# Patient Record
Sex: Female | Born: 1950 | Race: White | Hispanic: No | Smoking: Former smoker
Health system: Southern US, Community
[De-identification: ages and names within clinical notes are randomized; demographics above are authoritative.]

## PROBLEM LIST (undated history)

## (undated) DIAGNOSIS — I82409 Acute embolism and thrombosis of unspecified deep veins of unspecified lower extremity: Secondary | ICD-10-CM

## (undated) DIAGNOSIS — S83209A Unspecified tear of unspecified meniscus, current injury, unspecified knee, initial encounter: Secondary | ICD-10-CM

## (undated) DIAGNOSIS — F329 Major depressive disorder, single episode, unspecified: Secondary | ICD-10-CM

## (undated) DIAGNOSIS — F32A Depression, unspecified: Secondary | ICD-10-CM

## (undated) HISTORY — PX: TUBAL LIGATION: SHX77

## (undated) HISTORY — DX: Unspecified tear of unspecified meniscus, current injury, unspecified knee, initial encounter: S83.209A

## (undated) HISTORY — DX: Major depressive disorder, single episode, unspecified: F32.9

## (undated) HISTORY — DX: Depression, unspecified: F32.A

## (undated) HISTORY — PX: TONSILLECTOMY: SUR1361

---

## 2000-08-22 ENCOUNTER — Other Ambulatory Visit: Admission: RE | Admit: 2000-08-22 | Discharge: 2000-08-22 | Payer: Self-pay | Admitting: Gynecology

## 2001-04-26 ENCOUNTER — Encounter: Payer: Self-pay | Admitting: Internal Medicine

## 2001-04-26 ENCOUNTER — Encounter: Admission: RE | Admit: 2001-04-26 | Discharge: 2001-04-26 | Payer: Self-pay | Admitting: Internal Medicine

## 2002-05-07 ENCOUNTER — Ambulatory Visit (HOSPITAL_COMMUNITY): Admission: RE | Admit: 2002-05-07 | Discharge: 2002-05-07 | Payer: Self-pay | Admitting: Internal Medicine

## 2004-03-13 ENCOUNTER — Other Ambulatory Visit: Admission: RE | Admit: 2004-03-13 | Discharge: 2004-03-13 | Payer: Self-pay | Admitting: Internal Medicine

## 2006-06-21 ENCOUNTER — Ambulatory Visit (HOSPITAL_COMMUNITY): Admission: RE | Admit: 2006-06-21 | Discharge: 2006-06-21 | Payer: Self-pay | Admitting: Internal Medicine

## 2006-06-21 ENCOUNTER — Encounter: Payer: Self-pay | Admitting: Vascular Surgery

## 2006-07-28 ENCOUNTER — Encounter: Admission: RE | Admit: 2006-07-28 | Discharge: 2006-07-28 | Payer: Self-pay | Admitting: Internal Medicine

## 2006-12-13 ENCOUNTER — Ambulatory Visit: Payer: Self-pay | Admitting: Oncology

## 2006-12-28 LAB — CBC WITH DIFFERENTIAL/PLATELET
Basophils Absolute: 0 10*3/uL (ref 0.0–0.1)
EOS%: 1.8 % (ref 0.0–7.0)
HCT: 40.7 % (ref 34.8–46.6)
HGB: 14 g/dL (ref 11.6–15.9)
LYMPH%: 33.7 % (ref 14.0–48.0)
MCH: 30.8 pg (ref 26.0–34.0)
MCV: 89.4 fL (ref 81.0–101.0)
MONO%: 6.7 % (ref 0.0–13.0)
NEUT%: 57.4 % (ref 39.6–76.8)

## 2006-12-30 LAB — FIBRINOGEN: Fibrinogen: 362 mg/dL (ref 204–475)

## 2006-12-30 LAB — D-DIMER, QUANTITATIVE: D-Dimer, Quant: 0.38 ug/mL-FEU (ref 0.00–0.48)

## 2006-12-30 LAB — PROTHROMBIN GENE MUTATION

## 2007-05-18 ENCOUNTER — Encounter: Admission: RE | Admit: 2007-05-18 | Discharge: 2007-05-18 | Payer: Self-pay | Admitting: Interventional Radiology

## 2009-02-05 ENCOUNTER — Other Ambulatory Visit: Admission: RE | Admit: 2009-02-05 | Discharge: 2009-02-05 | Payer: Self-pay | Admitting: Internal Medicine

## 2011-12-03 ENCOUNTER — Other Ambulatory Visit: Payer: Self-pay | Admitting: Internal Medicine

## 2011-12-03 DIAGNOSIS — M25511 Pain in right shoulder: Secondary | ICD-10-CM

## 2011-12-03 DIAGNOSIS — M542 Cervicalgia: Secondary | ICD-10-CM

## 2011-12-07 ENCOUNTER — Ambulatory Visit
Admission: RE | Admit: 2011-12-07 | Discharge: 2011-12-07 | Disposition: A | Discharge status: Home / Self Care | Payer: 59 | Source: Ambulatory Visit | Attending: Internal Medicine | Admitting: Internal Medicine

## 2011-12-07 DIAGNOSIS — M25511 Pain in right shoulder: Secondary | ICD-10-CM

## 2011-12-07 DIAGNOSIS — M542 Cervicalgia: Secondary | ICD-10-CM

## 2013-01-15 ENCOUNTER — Other Ambulatory Visit (HOSPITAL_COMMUNITY)
Admission: RE | Admit: 2013-01-15 | Discharge: 2013-01-15 | Disposition: A | Discharge status: Home / Self Care | Payer: 59 | Source: Ambulatory Visit | Attending: Internal Medicine | Admitting: Internal Medicine

## 2013-01-15 DIAGNOSIS — Z01419 Encounter for gynecological examination (general) (routine) without abnormal findings: Secondary | ICD-10-CM | POA: Insufficient documentation

## 2016-08-14 DIAGNOSIS — M1990 Unspecified osteoarthritis, unspecified site: Secondary | ICD-10-CM | POA: Diagnosis not present

## 2016-08-14 DIAGNOSIS — M129 Arthropathy, unspecified: Secondary | ICD-10-CM | POA: Diagnosis not present

## 2016-08-20 DIAGNOSIS — M25562 Pain in left knee: Secondary | ICD-10-CM | POA: Diagnosis not present

## 2016-08-20 DIAGNOSIS — M1990 Unspecified osteoarthritis, unspecified site: Secondary | ICD-10-CM | POA: Diagnosis not present

## 2016-09-22 ENCOUNTER — Other Ambulatory Visit: Payer: Self-pay

## 2016-09-22 ENCOUNTER — Other Ambulatory Visit (HOSPITAL_COMMUNITY)
Admission: RE | Admit: 2016-09-22 | Discharge: 2016-09-22 | Disposition: A | Discharge status: Home / Self Care | Payer: 59 | Source: Ambulatory Visit | Attending: Registered Nurse | Admitting: Registered Nurse

## 2016-09-22 DIAGNOSIS — Z01419 Encounter for gynecological examination (general) (routine) without abnormal findings: Secondary | ICD-10-CM | POA: Diagnosis not present

## 2016-09-27 LAB — CYTOLOGY - PAP: DIAGNOSIS: NEGATIVE

## 2017-05-12 ENCOUNTER — Other Ambulatory Visit: Payer: Self-pay | Admitting: Orthopedic Surgery

## 2017-05-12 DIAGNOSIS — R609 Edema, unspecified: Secondary | ICD-10-CM

## 2017-05-12 DIAGNOSIS — R52 Pain, unspecified: Secondary | ICD-10-CM

## 2017-05-22 ENCOUNTER — Ambulatory Visit
Admission: RE | Admit: 2017-05-22 | Discharge: 2017-05-22 | Disposition: A | Discharge status: Home / Self Care | Payer: Medicare Other | Source: Ambulatory Visit | Attending: Orthopedic Surgery | Admitting: Orthopedic Surgery

## 2017-05-22 DIAGNOSIS — R609 Edema, unspecified: Secondary | ICD-10-CM

## 2017-05-22 DIAGNOSIS — R52 Pain, unspecified: Secondary | ICD-10-CM

## 2017-06-09 ENCOUNTER — Encounter (HOSPITAL_COMMUNITY): Payer: Self-pay | Admitting: Emergency Medicine

## 2017-06-09 ENCOUNTER — Emergency Department (HOSPITAL_COMMUNITY)
Admission: EM | Admit: 2017-06-09 | Discharge: 2017-06-09 | Disposition: A | Discharge status: Home / Self Care | Payer: 59 | Attending: Emergency Medicine | Admitting: Emergency Medicine

## 2017-06-09 ENCOUNTER — Emergency Department (HOSPITAL_BASED_OUTPATIENT_CLINIC_OR_DEPARTMENT_OTHER)
Admit: 2017-06-09 | Discharge: 2017-06-09 | Disposition: A | Discharge status: Home / Self Care | Payer: 59 | Attending: Emergency Medicine | Admitting: Emergency Medicine

## 2017-06-09 DIAGNOSIS — I82492 Acute embolism and thrombosis of other specified deep vein of left lower extremity: Secondary | ICD-10-CM | POA: Diagnosis not present

## 2017-06-09 DIAGNOSIS — M7989 Other specified soft tissue disorders: Secondary | ICD-10-CM

## 2017-06-09 DIAGNOSIS — I82402 Acute embolism and thrombosis of unspecified deep veins of left lower extremity: Secondary | ICD-10-CM

## 2017-06-09 DIAGNOSIS — R2242 Localized swelling, mass and lump, left lower limb: Secondary | ICD-10-CM | POA: Diagnosis present

## 2017-06-09 HISTORY — DX: Acute embolism and thrombosis of unspecified deep veins of unspecified lower extremity: I82.409

## 2017-06-09 LAB — BASIC METABOLIC PANEL
ANION GAP: 6 (ref 5–15)
BUN: 16 mg/dL (ref 6–20)
CHLORIDE: 107 mmol/L (ref 101–111)
CO2: 26 mmol/L (ref 22–32)
Calcium: 9.1 mg/dL (ref 8.9–10.3)
Creatinine, Ser: 1.16 mg/dL — ABNORMAL HIGH (ref 0.44–1.00)
GFR, EST AFRICAN AMERICAN: 56 mL/min — AB (ref >60)
GFR, EST NON AFRICAN AMERICAN: 48 mL/min — AB (ref >60)
Glucose, Bld: 117 mg/dL — ABNORMAL HIGH (ref 65–99)
POTASSIUM: 3.6 mmol/L (ref 3.5–5.1)
SODIUM: 139 mmol/L (ref 135–145)

## 2017-06-09 LAB — CBC WITH DIFFERENTIAL/PLATELET
BASOS ABS: 0 10*3/uL (ref 0.0–0.1)
Basophils Relative: 0 %
EOS ABS: 0.2 10*3/uL (ref 0.0–0.7)
EOS PCT: 2 %
HCT: 42.8 % (ref 36.0–46.0)
HEMOGLOBIN: 13.9 g/dL (ref 12.0–15.0)
LYMPHS ABS: 2.9 10*3/uL (ref 0.7–4.0)
Lymphocytes Relative: 28 %
MCH: 29.6 pg (ref 26.0–34.0)
MCHC: 32.5 g/dL (ref 30.0–36.0)
MCV: 91.3 fL (ref 78.0–100.0)
Monocytes Absolute: 0.5 10*3/uL (ref 0.1–1.0)
Monocytes Relative: 5 %
Neutro Abs: 6.6 10*3/uL (ref 1.7–7.7)
Neutrophils Relative %: 65 %
PLATELETS: 151 10*3/uL (ref 150–400)
RBC: 4.69 MIL/uL (ref 3.87–5.11)
RDW: 13.8 % (ref 11.5–15.5)
WBC: 10.3 10*3/uL (ref 4.0–10.5)

## 2017-06-09 MED ORDER — RIVAROXABAN (XARELTO) EDUCATION KIT FOR DVT/PE PATIENTS
PACK | Freq: Once | Status: AC
  Administered 2017-06-09: 14:00:00
  Filled 2017-06-09: qty 1

## 2017-06-09 MED ORDER — RIVAROXABAN 15 MG PO TABS
15.0000 mg | ORAL_TABLET | Freq: Once | ORAL | Status: AC
  Administered 2017-06-09: 15 mg via ORAL
  Filled 2017-06-09: qty 1

## 2017-06-09 MED ORDER — RIVAROXABAN (XARELTO) VTE STARTER PACK (15 & 20 MG): ORAL_TABLET | ORAL | 0 refills | Status: DC

## 2017-06-09 NOTE — ED Triage Notes (Signed)
Pt sts increased left leg swelling with hx of DVT

## 2017-06-09 NOTE — ED Provider Notes (Signed)
[place] DEPT Provider Note   CSN: [id number] Arrival date & time: 06/09/17  1029     History   Chief Complaint Chief Complaint  Patient presents with  . Leg Swelling    HPI Julie [name] is a 66 y.o. female. Chief complaint is left leg pain and swelling.  HPI:  66 y/o female with left leg swelling and pain for the last few weeks. She saw her orthopedist regarding some left leg pain and is scheduled for arthroscopy of left leg. Became concerned because her foot is swollen to the knee and presents via referral from primary care for outpatient ultrasound which showed left superficial femoral DVT to the mid tibia veins.  She has no chest pain, shortness of breath, dyspnea, or hypoxemia. Has single prior DVT 10 years ago. Treated with an unknown medication. She does not recall any inciting events prior to this first episode.  Past Medical History:  Diagnosis Date  . DVT (deep venous thrombosis) ([place])     There are no active problems to display for this patient.   History reviewed. No pertinent surgical history.  OB History    No data available       Home Medications    Prior to Admission medications   Medication Sig Start Date End Date Taking? Authorizing Provider  Rivaroxaban 15 & 20 MG TBPK Take as directed on package: Start with one '15mg'$  tablet by mouth twice a day with food. On Day 22, switch to one '20mg'$  tablet once a day with food. 06/09/17   Tanna Furry, MD    Family History History reviewed. No pertinent family history.  Social History Social History  Substance Use Topics  . Smoking status: Never Smoker  . Smokeless tobacco: Never Used  . Alcohol use No     Allergies   Patient has no known allergies.   Review of Systems Review of Systems  Constitutional: Negative for appetite change, chills, diaphoresis, fatigue and fever.  HENT: Negative for mouth sores, sore throat and trouble swallowing.   Eyes: Negative for visual disturbance.    Respiratory: Negative for cough, chest tightness, shortness of breath and wheezing.   Cardiovascular: Positive for leg swelling. Negative for chest pain.  Gastrointestinal: Negative for abdominal distention, abdominal pain, diarrhea, nausea and vomiting.  Endocrine: Negative for polydipsia, polyphagia and polyuria.  Genitourinary: Negative for dysuria, frequency and hematuria.  Musculoskeletal: Negative for gait problem.  Skin: Negative for color change, pallor and rash.  Neurological: Negative for dizziness, syncope, light-headedness and headaches.  Hematological: Does not bruise/bleed easily.  Psychiatric/Behavioral: Negative for behavioral problems and confusion.     Physical Exam Updated Vital Signs BP (!) 145/80   Pulse 71   Temp 98.4 F (36.9 C) (Oral)   Resp 16   SpO2 99%   Physical Exam  Constitutional: She is oriented to person, place, and time. She appears well-developed and well-nourished. No distress.  HENT:  Head: Normocephalic.  Eyes: Pupils are equal, round, and reactive to light. Conjunctivae are normal. No scleral icterus.  Neck: Normal range of motion. Neck supple. No thyromegaly present.  Cardiovascular: Normal rate and regular rhythm.  Exam reveals no gallop and no friction rub.   No murmur heard. Pulmonary/Chest: Effort normal and breath sounds normal. No respiratory distress. She has no wheezes. She has no rales.  Abdominal: Soft. Bowel sounds are normal. She exhibits no distension. There is no tenderness. There is no rebound.  Musculoskeletal: Normal range of motion.  Left leg shows edema  soft tissue swelling from knee distally. Positive pulses. Good sensation.  Neurological: She is alert and oriented to person, place, and time.  Skin: Skin is warm and dry. No rash noted.  Psychiatric: She has a normal mood and affect. Her behavior is normal.     ED Treatments / Results  Labs (all labs ordered are listed, but only abnormal results are  displayed) Labs Reviewed  BASIC METABOLIC PANEL - Abnormal; Notable for the following:       Result Value   Glucose, Bld 117 (*)    Creatinine, Ser 1.16 (*)    GFR calc non Af Amer 48 (*)    GFR calc Af Amer 56 (*)    All other components within normal limits  CBC WITH DIFFERENTIAL/PLATELET    EKG  EKG Interpretation None       Radiology No results found.  Procedures Procedures (including critical care time)  Medications Ordered in ED Medications  [name]) Education Kit for DVT/PE patients ( Does not apply Given 06/09/17 1346)  Rivaroxaban (XARELTO) tablet 15 mg (15 mg Oral Given 06/09/17 1344)     Initial Impression / Assessment and Plan / ED Course  I have reviewed the triage vital signs and the nursing notes.  Pertinent labs & imaging results that were available during my care of the patient were reviewed by me and considered in my medical decision making (see chart for details).    Vascular ultrasound shows DVT as above. Treatment regarding anticoagulation discussed with patient including risks of bleeding/hemorrhage. She has no contraindication. She expressed understanding of the risk of bleeding including death. She agrees to proceed. Denies reporting any bleeding, injury, shortness of breath chest pain dyspnea. Follow up with her primary care physician Dr. Lavonna Monarch.  Forearm D has discussed Xarelto initiation with patient. Given first dose here. Prescription for 15 mg twice a day for 15 days followed by 20 mg daily. All questions asked and answered.  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 On Day 22, switch to one '20mg'$  tablet once a day with food.     Tanna Furry, MD 06/09/17 [phone]

## 2017-06-09 NOTE — Discharge Instructions (Signed)
Elevate leg whenever possible. Normal activities, no restrictions. Report bleeding of any kind. Physician evaluation with any injuries.  Information on my medicine - XARELTO (rivaroxaban)  This medication education was reviewed with me or my healthcare representative as part of my discharge preparation.  WHY WAS XARELTO PRESCRIBED FOR YOU? Xarelto was prescribed to treat blood clots that may have been found in the veins of your legs (deep vein thrombosis) or in your lungs (pulmonary embolism) and to reduce the risk of them occurring again.  What do you need to know about Xarelto? The starting dose is one 15 mg tablet taken TWICE daily with food for the FIRST 21 DAYS then on (enter date)  06/30/2017  the dose is changed to one 20 mg tablet taken ONCE A DAY with your evening meal.  DO NOT stop taking Xarelto without talking to the health care provider who prescribed the medication.  Refill your prescription for 20 mg tablets before you run out.  After discharge, you should have regular check-up appointments with your healthcare provider that is prescribing your Xarelto.  In the future your dose may need to be changed if your kidney function changes by a significant amount.  What do you do if you miss a dose? If you are taking Xarelto TWICE DAILY and you miss a dose, take it as soon as you remember. You may take two 15 mg tablets (total 30 mg) at the same time then resume your regularly scheduled 15 mg twice daily the next day.  If you are taking Xarelto ONCE DAILY and you miss a dose, take it as soon as you remember on the same day then continue your regularly scheduled once daily regimen the next day. Do not take two doses of Xarelto at the same time.   Important Safety Information Xarelto is a blood thinner medicine that can cause bleeding. You should call your healthcare provider right away if you experience any of the following: ? Bleeding from an injury or your nose that does not  stop. ? Unusual colored urine (red or dark brown) or unusual colored stools (red or black). ? Unusual bruising for unknown reasons. ? A serious fall or if you hit your head (even if there is no bleeding).  Some medicines may interact with Xarelto and might increase your risk of bleeding while on Xarelto. To help avoid this, consult your healthcare provider or pharmacist prior to using any new prescription or non-prescription medications, including herbals, vitamins, non-steroidal anti-inflammatory drugs (NSAIDs) and supplements.  This website has more information on Xarelto: [email or web address]

## 2017-06-09 NOTE — ED Notes (Signed)
Vascular informed to return patient to D [facility]

## 2017-06-09 NOTE — Progress Notes (Signed)
*  Preliminary Results* Left lower extremity venous duplex completed. Left lower extremity is positive for acute deep vein thrombosis involving the left distal femoral vein, popliteal vein, posterior tibial vein, and peroneal vein. There is no evidence of left Baker's cyst.  Preliminary results discussed with Judson Roch, RN.  06/09/2017 1:07 PM  Maudry Mayhew, BS, RVT, RDCS, RDMS

## 2018-02-01 ENCOUNTER — Other Ambulatory Visit: Payer: Self-pay | Admitting: Radiology

## 2018-02-03 ENCOUNTER — Telehealth: Payer: Self-pay | Admitting: Hematology and Oncology

## 2018-02-03 NOTE — Telephone Encounter (Signed)
Spoke with patient to confirm morning [facility] appointment for 3/13, [facility] patient no packet sent

## 2018-02-06 ENCOUNTER — Encounter: Payer: Self-pay | Admitting: *Deleted

## 2018-02-07 ENCOUNTER — Other Ambulatory Visit: Payer: Self-pay | Admitting: *Deleted

## 2018-02-07 DIAGNOSIS — C50412 Malignant neoplasm of upper-outer quadrant of left female breast: Secondary | ICD-10-CM | POA: Insufficient documentation

## 2018-02-07 DIAGNOSIS — Z170 Estrogen receptor positive status [ER+]: Principal | ICD-10-CM

## 2018-02-08 ENCOUNTER — Encounter: Payer: Self-pay | Admitting: Hematology and Oncology

## 2018-02-08 ENCOUNTER — Ambulatory Visit
Admission: RE | Admit: 2018-02-08 | Discharge: 2018-02-08 | Disposition: A | Discharge status: Home / Self Care | Payer: Medicare Other | Source: Ambulatory Visit | Attending: Radiation Oncology | Admitting: Radiation Oncology

## 2018-02-08 ENCOUNTER — Encounter: Payer: Self-pay | Admitting: Radiation Oncology

## 2018-02-08 ENCOUNTER — Ambulatory Visit: Payer: 59 | Attending: Surgery | Admitting: Physical Therapy

## 2018-02-08 ENCOUNTER — Encounter: Payer: Self-pay | Admitting: *Deleted

## 2018-02-08 ENCOUNTER — Ambulatory Visit: Payer: Self-pay | Admitting: Surgery

## 2018-02-08 ENCOUNTER — Inpatient Hospital Stay: Payer: 59

## 2018-02-08 ENCOUNTER — Encounter: Payer: Self-pay | Admitting: Physical Therapy

## 2018-02-08 ENCOUNTER — Other Ambulatory Visit: Payer: Self-pay | Admitting: *Deleted

## 2018-02-08 ENCOUNTER — Other Ambulatory Visit: Payer: Self-pay

## 2018-02-08 ENCOUNTER — Inpatient Hospital Stay: Payer: 59 | Attending: Hematology and Oncology | Admitting: Hematology and Oncology

## 2018-02-08 DIAGNOSIS — F17210 Nicotine dependence, cigarettes, uncomplicated: Secondary | ICD-10-CM | POA: Insufficient documentation

## 2018-02-08 DIAGNOSIS — Z86718 Personal history of other venous thrombosis and embolism: Secondary | ICD-10-CM | POA: Diagnosis not present

## 2018-02-08 DIAGNOSIS — Z170 Estrogen receptor positive status [ER+]: Secondary | ICD-10-CM | POA: Diagnosis present

## 2018-02-08 DIAGNOSIS — C50912 Malignant neoplasm of unspecified site of left female breast: Secondary | ICD-10-CM

## 2018-02-08 DIAGNOSIS — C50412 Malignant neoplasm of upper-outer quadrant of left female breast: Secondary | ICD-10-CM

## 2018-02-08 DIAGNOSIS — Z79899 Other long term (current) drug therapy: Secondary | ICD-10-CM | POA: Diagnosis not present

## 2018-02-08 DIAGNOSIS — R293 Abnormal posture: Secondary | ICD-10-CM | POA: Diagnosis present

## 2018-02-08 DIAGNOSIS — F329 Major depressive disorder, single episode, unspecified: Secondary | ICD-10-CM | POA: Diagnosis not present

## 2018-02-08 LAB — CBC WITH DIFFERENTIAL (CANCER CENTER ONLY)
Basophils Absolute: 0.1 10*3/uL (ref 0.0–0.1)
Basophils Relative: 1 %
EOS ABS: 0.2 10*3/uL (ref 0.0–0.5)
EOS PCT: 3 %
HCT: 46.4 % (ref 34.8–46.6)
Hemoglobin: 15.1 g/dL (ref 11.6–15.9)
Lymphocytes Relative: 30 %
Lymphs Abs: 2.3 10*3/uL (ref 0.9–3.3)
MCH: 29.9 pg (ref 25.1–34.0)
MCHC: 32.7 g/dL (ref 31.5–36.0)
MCV: 91.5 fL (ref 79.5–101.0)
MONO ABS: 0.7 10*3/uL (ref 0.1–0.9)
Monocytes Relative: 9 %
Neutro Abs: 4.4 10*3/uL (ref 1.5–6.5)
Neutrophils Relative %: 57 %
PLATELETS: 218 10*3/uL (ref 145–400)
RBC: 5.07 MIL/uL (ref 3.70–5.45)
RDW: 13.3 % (ref 11.2–14.5)
WBC: 7.6 10*3/uL (ref 3.9–10.3)

## 2018-02-08 LAB — CMP (CANCER CENTER ONLY)
ALT: 24 U/L (ref 0–55)
ANION GAP: 6 (ref 3–11)
AST: 18 U/L (ref 5–34)
Albumin: 3.8 g/dL (ref 3.5–5.0)
Alkaline Phosphatase: 80 U/L (ref 40–150)
BUN: 14 mg/dL (ref 7–26)
CO2: 28 mmol/L (ref 22–29)
CREATININE: 1.16 mg/dL — AB (ref 0.60–1.10)
Calcium: 9.8 mg/dL (ref 8.4–10.4)
Chloride: 109 mmol/L (ref 98–109)
GFR, EST AFRICAN AMERICAN: 56 mL/min — AB (ref >60)
GFR, Estimated: 48 mL/min — ABNORMAL LOW (ref >60)
Glucose, Bld: 123 mg/dL (ref 70–140)
POTASSIUM: 4.5 mmol/L (ref 3.5–5.1)
SODIUM: 143 mmol/L (ref 136–145)
Total Bilirubin: 0.7 mg/dL (ref 0.2–1.2)
Total Protein: 7.3 g/dL (ref 6.4–8.3)

## 2018-02-08 NOTE — H&P (Signed)
Julie [name] Documented: 02/08/2018 7:27 AM Location: [facility] Patient #: [id number] DOB: Apr 15, 1951 Undefined / Language: Julie [name] / Race: White Female  History of Present Illness Julie Moores [name]. Carel Carrier MD; 02/08/2018 11:08 AM) Patient words: Pt sent at the request of Dr Isidore Moos for mammographic abnomality of left breast UOQ. This was not on mammogram and U/S. Area measured 2 .2 cm. Path ILC grade1 ER/PR POS HER 2 NEG. Axilla normal. She has no complaints.                      ADDITIONAL INFORMATION: PROGNOSTIC INDICATORS Results: IMMUNOHISTOCHEMICAL AND MORPHOMETRIC ANALYSIS PERFORMED MANUALLY Estrogen Receptor: 95%, POSITIVE, STRONG STAINING INTENSITY Progesterone Receptor: 100%, POSITIVE, STRONG STAINING INTENSITY Proliferation Marker Ki67: 1% REFERENCE RANGE ESTROGEN RECEPTOR NEGATIVE 0% POSITIVE =>1% REFERENCE RANGE PROGESTERONE RECEPTOR NEGATIVE 0% POSITIVE =>1% All controls stained appropriately Julie Sheller MD Pathologist, Electronic Signature ( Signed 02/07/2018) FLUORESCENCE IN-SITU HYBRIDIZATION Results: HER2 - NEGATIVE RATIO OF HER2/CEP17 SIGNALS 1.17 AVERAGE HER2 COPY NUMBER PER CELL 2.05 Reference Range: NEGATIVE HER2/CEP17 Ratio <2.0 and average HER2 copy number <4.0 EQUIVOCAL HER2/CEP17 Ratio <2.0 and average HER2 copy number >=4.0 and <6.0 1 of 3 FINAL for [facility], Julie [name] ([id number]) ADDITIONAL INFORMATION:(continued) POSITIVE HER2/CEP17 Ratio >=2.0 or <2.0 and average HER2 copy number >=6.0 Julie Males MD Pathologist, Electronic Signature ( Signed 02/08/2018) The malignant cells appear negative for e-cadherin, supporting [name] lobular phenotype. Julie Cutter MD Pathologist, Electronic Signature ( Signed 02/02/2018) FINAL DIAGNOSIS Diagnosis Breast, left, needle core biopsy, 2 o'clock - INVASIVE MAMMARY CARCINOMA. - MAMMARY CARCINOMA IN SITU. - SEE COMMENT. Microscopic Comment The carcinoma appears grade I.  E-Cadherin and [name] breast prognostic profile will be performed and the results reported separately. The results were called to [facility] on 02/02/2018. (JBK:ah 02/02/18) Julie Cutter MD Pathologist, Electronic Signature (Case signed 02/02/2018) Specimen Gross and Clinical Information Specimen Comment In formalin @ 10:27 AM; highly suspicious mass Specimen(s) Obtained: Breast, left, needle core biopsy, 2 o'clock Gross Received in formalin labeled with the patient's name and.  The patient is [name] 67 year old female.   Past Surgical History Julie Pummel, RN; 02/08/2018 7:27 AM) Breast Biopsy Left. Colon Polyp Removal - Colonoscopy Foot Surgery Bilateral. Oral Surgery Tonsillectomy Ventral / Umbilical Hernia Surgery Bilateral.  Diagnostic Studies History Julie Pummel, RN; 02/08/2018 7:27 AM) Colonoscopy 1-5 years ago Mammogram within last year Pap Smear 1-5 years ago  Medication History Julie Pummel, RN; 02/08/2018 7:27 AM) Medications Reconciled  Social History Julie Pummel, RN; 02/08/2018 7:27 AM) Alcohol use Occasional alcohol use. Caffeine use Carbonated beverages, Tea. No drug use Tobacco use Current some day smoker.  Family History Julie Pummel, RN; 02/08/2018 7:27 AM) Alcohol Abuse Father. Arthritis Mother. Breast Cancer Family Members In General, Mother. Cancer Father. Depression Father. Diabetes Mellitus Family Members In General. Heart Disease Mother. Migraine Headache Sister. Prostate Cancer Father. Respiratory Condition Father.  Pregnancy / Birth History Julie Pummel, RN; 02/08/2018 7:27 AM) Age at menarche 6 years. Age of menopause 51-55 Contraceptive History Oral contraceptives. Gravida 1 Irregular periods Maternal age 68-35 Para 0  Other Problems Julie Pummel, RN; 02/08/2018 7:27 AM) Arthritis Back Pain Bladder Problems Breast Cancer Depression Gastroesophageal Reflux Disease General  anesthesia - complications Hemorrhoids Lump In Breast Pulmonary Embolism / Blood Clot in Legs Umbilical Hernia Repair Vascular Disease     Review of Systems Julie Spillers Ledford RN; 02/08/2018 7:27 AM) General Not Present- Appetite Loss, Chills, Fatigue, Fever, Night Sweats, Weight Gain and Weight Loss. Skin Present-  Dryness. Not Present- Change in Wart/Mole, Hives, Jaundice, New Lesions, Non-Healing Wounds, Rash and Ulcer. HEENT Present- Seasonal Allergies, Sinus Pain and Wears glasses/contact lenses. Not Present- Earache, Hearing Loss, Hoarseness, Nose Bleed, Oral Ulcers, Ringing in the Ears, Sore Throat, Visual Disturbances and Yellow Eyes. Respiratory Present- Snoring. Not Present- Bloody sputum, Chronic Cough, Difficulty Breathing and Wheezing. Breast Not Present- Breast Mass, Breast Pain, Nipple Discharge and Skin Changes. Cardiovascular Present- Swelling of Extremities. Not Present- Chest Pain, Difficulty Breathing Lying Down, Leg Cramps, Palpitations, Rapid Heart Rate and Shortness of Breath. Gastrointestinal Present- Hemorrhoids. Not Present- Abdominal Pain, Bloating, Bloody Stool, Change in Bowel Habits, Chronic diarrhea, Constipation, Difficulty Swallowing, Excessive gas, Gets full quickly at meals, Indigestion, Nausea, Rectal Pain and Vomiting. Female Genitourinary Not Present- Frequency, Nocturia, Painful Urination, Pelvic Pain and Urgency. Musculoskeletal Present- Back Pain, Joint Pain and Muscle Weakness. Not Present- Joint Stiffness, Muscle Pain and Swelling of Extremities. Psychiatric Present- Depression. Not Present- Anxiety, Bipolar, Change in Sleep Pattern, Fearful and Frequent crying. Endocrine Not Present- Cold Intolerance, Excessive Hunger, Hair Changes, Heat Intolerance, Hot flashes and New Diabetes. Hematology Present- Blood Thinners, Easy Bruising and Excessive bleeding. Not Present- Gland problems, HIV and Persistent Infections.   Physical Exam (Julie Pete [name]. Olanrewaju Osborn  MD; 02/08/2018 11:09 AM)  General Mental Status-Alert. General Appearance-Consistent with stated age. Hydration-Well hydrated. Voice-Normal.  Head and Neck Head-normocephalic, atraumatic with no lesions or palpable masses. Trachea-midline. Thyroid Gland Characteristics - normal size and consistency.  Eye Eyeball - Bilateral-Extraocular movements intact. Sclera/Conjunctiva - Bilateral-No scleral icterus.  Chest and Lung Exam Chest and lung exam reveals -quiet, even and easy respiratory effort with no use of accessory muscles and on auscultation, normal breath sounds, no adventitious sounds and normal vocal resonance. Inspection Chest Wall - Normal. Back - normal.  Breast Breast - Left-Symmetric, Non Tender, No Biopsy scars, no Dimpling, No Inflammation, No Lumpectomy scars, No Mastectomy scars, No Peau d' Orange. Breast - Right-Symmetric, Non Tender, No Biopsy scars, no Dimpling, No Inflammation, No Lumpectomy scars, No Mastectomy scars, No Peau d' Orange. Breast Lump-No Palpable Breast Mass.  Cardiovascular Cardiovascular examination reveals -normal heart sounds, regular rate and rhythm with no murmurs and normal pedal pulses bilaterally.  Neurologic Neurologic evaluation reveals -alert and oriented x 3 with no impairment of recent or remote memory. Mental Status-Normal.  Musculoskeletal Normal Exam - Left-Upper Extremity Strength Normal and Lower Extremity Strength Normal. Normal Exam - Right-Upper Extremity Strength Normal and Lower Extremity Strength Normal.  Lymphatic Head & Neck  General Head & Neck Lymphatics: Bilateral - Description - Normal. Axillary  General Axillary Region: Bilateral - Description - Normal. Tenderness - Non Tender.    Assessment & Plan (Azuri Bozard [name]. Saquoia Sianez MD; 02/08/2018 11:10 AM)  BREAST CANCER, LEFT (C50.912) Impression: Pt needs MRI She would like lumpectomy and SLN mapping  Discussed mastectomy and  reconstruction as well   Risk of lumpectomy include bleeding, infection, seroma, more surgery, use of seed/wire, wound care, cosmetic deformity and the need for other treatments, death , blood clots, death. Pt agrees to proceed. Risk of sentinel lymph node mapping include bleeding, infection, lymphedema, shoulder pain. stiffness, dye allergy. cosmetic deformity , blood clots, death, need for more surgery. Pt agres to proceed.  Current Plans You are being scheduled for surgery- Our schedulers will call you.  You should hear from our office's scheduling department within 5 working days about the location, date, and time of surgery. We try to make accommodations for patient's preferences in scheduling surgery, but sometimes the  OR schedule or the surgeon's schedule prevents [place] from making those accommodations.  If you have not heard from our office [phone]) in 5 working days, call the office and ask for your surgeon's nurse.  If you have other questions about your diagnosis, plan, or surgery, call the office and ask for your surgeon's nurse.  Pt Education - [facility] Breast Cancer Information Given - [facility] "Breast Journey" Package We discussed the staging and pathophysiology of breast cancer. We discussed all of the different options for treatment for breast cancer including surgery, chemotherapy, radiation therapy, Herceptin, and antiestrogen therapy. We discussed [name] sentinel lymph node biopsy as she does not appear to having lymph node involvement right now. We discussed the performance of that with injection of radioactive tracer and blue dye. We discussed that she would have an incision underneath her axillary hairline. We discussed that there is [name] bout [name] 10-20% chance of having [name] positive node with [name] sentinel lymph node biopsy and we will await the permanent pathology to make any other first further decisions in terms of her treatment. One of these options might be to return to the operating  room to perform an axillary lymph node dissection. We discussed about [name] 1-2% risk lifetime of chronic shoulder pain as well as lymphedema associated with [name] sentinel lymph node biopsy. We discussed the options for treatment of the breast cancer which included lumpectomy versus [name] mastectomy. We discussed the performance of the lumpectomy with [name] wire placement. We discussed [name] 10-20% chance of [name] positive margin requiring reexcision in the operating room. We also discussed that she may need radiation therapy or antiestrogen therapy or both if she undergoes lumpectomy. We discussed the mastectomy and the postoperative care for that as well. We discussed that there is no difference in her survival whether she undergoes lumpectomy with radiation therapy or antiestrogen therapy versus [name] mastectomy. There is [name] slight difference in the local recurrence rate being 3-5% with lumpectomy and about 1% with [name] mastectomy. We discussed the risks of operation including bleeding, infection, possible reoperation. She understands her further therapy will be based on what her stages at the time of her operation.  Pt Education - [facility] breast cancer surgery: discussed with patient and provided information. Pt Education - ABC (After Breast Cancer) Class Info: discussed with patient and provided information. Pt Education - [facility] Breast Pains Education

## 2018-02-08 NOTE — Assessment & Plan Note (Signed)
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 After reviewing the pathology in detail, we proceeded to discuss the different treatment options between surgery, radiation, chemotherapy, antiestrogen therapies.  Treatment plan: 1.  Breast MRI 2. breast conserving surgery with sentinel lymph node biopsy 3.  Oncotype DX testing to determine if she would benefit from chemotherapy 4.  Adjuvant radiation therapy followed by 5 adjuvant antiestrogen therapy.  -------------------------------------------------------------------------------------------------------------- Oncotype counseling: I discussed Oncotype DX test. I explained to the patient that this is a 21 gene panel to evaluate patient tumors DNA to calculate recurrence score. This would help determine whether patient has high risk or intermediate risk or low risk breast cancer. She understands that if her tumor was found to be high risk, she would benefit from systemic chemotherapy. If low risk, no need of chemotherapy. If she was found to be intermediate risk, we would need to evaluate the score as well as other risk factors and determine if an abbreviated chemotherapy may be of benefit.   Return to clinic after surgery to discuss the final pathology report

## 2018-02-08 NOTE — Progress Notes (Signed)
Clinical Social Work [place] Psychosocial Distress Screening [place]  Patient completed distress screening protocol and scored a 7 on the Psychosocial Distress Thermometer which indicates moderate distress. Clinical Social Worker met with patient and patients friend in [facility] to assess for distress and other psychosocial needs. Patient stated she was feeling overwhelmed but felt "better" after meeting with the treatment team and getting more information on her treatment plan. CSW and patient discussed common feeling and emotions when being diagnosed with cancer, and the importance of support during treatment. CSW informed patient of the support team and support services at [facility], and patient was agreeable to an [employer] referral. CSW provided contact information and encouraged patient to call with any questions or concerns.  ONCBCN DISTRESS SCREENING 02/08/2018  Screening Type Initial Screening  Distress experienced in past week (1-10) 7  Information Concerns Type Lack of info about diagnosis;Lack of info about treatment  Referral to support programs Yes     Johnnye Lana, MSW, LCSW, OSW-C Clinical Social Worker [facility] [phone]

## 2018-02-08 NOTE — Patient Instructions (Signed)

## 2018-02-08 NOTE — Progress Notes (Signed)
[facility] CONSULT NOTE  Patient Care Team: Deland Pretty, MD as PCP - General (Internal Medicine)  CHIEF COMPLAINTS/PURPOSE OF CONSULTATION:  Newly diagnosed breast cancer  HISTORY OF PRESENTING ILLNESS:  Julie [name] 67 y.o. female is here because of recent diagnosis of {left breast cancer.  Patient had a routine screening mammogram that detected artery ductal distortion in the left breast with calcifications in the upper outer quadrant.  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  She was presented this morning to the multidisciplinary tumor board and she is here today at the [facility] clinic to discuss the treatment plan.  I reviewed her records extensively and collaborated the history with the patient.  SUMMARY OF ONCOLOGIC HISTORY:   Malignant neoplasm of upper-outer quadrant of left breast in female, estrogen receptor positive ([place])   02/01/2018 Initial Diagnosis    Screening detected architectural distortion in the left breast with calcifications UOQ, by ultrasound 2.2 cm at 2 o'clock position axilla negative, ultrasound biopsy revealed invasive lobular cancer grade 1 with LCIS ER 95% PR 100%, Ki-67 1%, HER-2 negative ratio 1.17, T2N0 stage Ib AJCC 8      MEDICAL HISTORY:  Past Medical History:  Diagnosis Date  . Depression   . DVT (deep venous thrombosis) ([place])   . Torn meniscus     SURGICAL HISTORY: Past Surgical History:  Procedure Laterality Date  . TONSILLECTOMY    . TUBAL LIGATION      SOCIAL HISTORY: Social History   Socioeconomic History  . Marital status: Divorced    Spouse name: Not on file  . Number of children: Not on file  . Years of education: Not on file  . Highest education level: Not on file  Social Needs  . Financial resource strain: Not on file  . Food insecurity - worry: Not on file  . Food insecurity -  inability: Not on file  . Transportation needs - medical: Not on file  . Transportation needs - non-medical: Not on file  Occupational History  . Not on file  Tobacco Use  . Smoking status: Current Every Day Smoker    Packs/day: 1.00    Types: Cigarettes  . Smokeless tobacco: Never Used  Substance and Sexual Activity  . Alcohol use: Yes  . Drug use: No  . Sexual activity: Not on file  Other Topics Concern  . Not on file  Social History Narrative  . Not on file    FAMILY HISTORY: Family History  Problem Relation Age of Onset  . Breast cancer Mother   . Breast cancer Paternal Aunt     ALLERGIES:  has No Known Allergies.  MEDICATIONS:  Current Outpatient Medications  Medication Sig Dispense Refill  . buPROPion (WELLBUTRIN XL) 300 MG 24 hr tablet Take by mouth.    . Vilazodone HCl (VIIBRYD) 40 MG TABS Take by mouth.    . Rivaroxaban 15 & 20 MG TBPK Take as directed on package: Start with one '15mg'$  tablet by mouth twice a day with food. On Day 22, switch to one '20mg'$  tablet once a day with food. 51 each 0   No current facility-administered medications for this visit.     REVIEW OF SYSTEMS:   Constitutional: Denies fevers, chills or abnormal night sweats Eyes: Denies blurriness of vision, double vision or watery eyes Ears, nose, mouth, throat, and  face: Denies mucositis or sore throat Respiratory: Denies cough, dyspnea or wheezes Cardiovascular: Denies palpitation, chest discomfort or lower extremity swelling Gastrointestinal:  Denies nausea, heartburn or change in bowel habits Skin: Denies abnormal skin rashes Lymphatics: Denies new lymphadenopathy or easy bruising Neurological:Denies numbness, tingling or new weaknesses Behavioral/Psych: Mood is stable, no new changes   All other systems were reviewed with the patient and are negative.  PHYSICAL EXAMINATION: ECOG PERFORMANCE STATUS: 1 - Symptomatic but completely ambulatory  Vitals:   02/08/18 0847  BP: (!) 159/91   Pulse: 70  Resp: 18  Temp: 97.9 F (36.6 C)  SpO2: 98%   Filed Weights   02/08/18 0847  Weight: 209 lb (94.8 kg)    GENERAL:alert, no distress and comfortable SKIN: skin color, texture, turgor are normal, no rashes or significant lesions EYES: normal, conjunctiva are pink and non-injected, sclera clear OROPHARYNX:no exudate, no erythema and lips, buccal mucosa, and tongue normal  NECK: supple, thyroid normal size, non-tender, without nodularity LYMPH:  no palpable lymphadenopathy in the cervical, axillary or inguinal LUNGS: clear to auscultation and percussion with normal breathing effort HEART: regular rate & rhythm and no murmurs and no lower extremity edema ABDOMEN:abdomen soft, non-tender and normal bowel sounds Musculoskeletal:no cyanosis of digits and no clubbing  PSYCH: alert & oriented x 3 with fluent speech NEURO: no focal motor/sensory deficits BREAST: No palpable nodules in breast. No palpable axillary or supraclavicular lymphadenopathy (exam performed in the presence of a chaperone)   LABORATORY DATA:  I have reviewed the data as listed Lab Results  Component Value Date   WBC 7.6 02/08/2018   HGB 13.9 06/09/2017   HCT 46.4 02/08/2018   MCV 91.5 02/08/2018   PLT 218 02/08/2018   Lab Results  Component Value Date   NA 143 02/08/2018   K 4.5 02/08/2018   CL 109 02/08/2018   CO2 28 02/08/2018    RADIOGRAPHIC STUDIES: I have personally reviewed the radiological reports and agreed with the findings in the report.  ASSESSMENT AND PLAN:  Malignant neoplasm of upper-outer quadrant of left breast in female, estrogen receptor positive ([place]) 02/01/2018:Screening detected architectural distortion in the left breast with calcifications UOQ, by ultrasound 2.2 cm at 2 o'clock position axilla negative, ultrasound biopsy revealed invasive lobular cancer grade 1 with LCIS ER 95% PR 100%, Ki-67 1%, HER-2 negative ratio 1.17, T2N0 stage Ib AJCC 8  Pathology and radiology  counseling: Discussed with the patient, the details of pathology including the type of breast cancer,the clinical staging, the significance of ER, PR and HER-2/neu receptors and the implications for treatment. After reviewing the pathology in detail, we proceeded to discuss the different treatment options between surgery, radiation, chemotherapy, antiestrogen therapies.  Treatment plan: 1.  Breast MRI 2. breast conserving surgery with sentinel lymph node biopsy 3.  Oncotype DX testing to determine if she would benefit from chemotherapy 4.  Adjuvant radiation therapy followed by 5 adjuvant antiestrogen therapy.  -------------------------------------------------------------------------------------------------------------- Oncotype counseling: I discussed Oncotype DX test. I explained to the patient that this is a 21 gene panel to evaluate patient tumors DNA to calculate recurrence score. This would help determine whether patient has high risk or intermediate risk or low risk breast cancer. She understands that if her tumor was found to be high risk, she would benefit from systemic chemotherapy. If low risk, no need of chemotherapy. If she was found to be intermediate risk, we would need to evaluate the score as well as other risk factors and determine if  an abbreviated chemotherapy may be of benefit.   Return to clinic after surgery to discuss the final pathology report   All questions were answered. The patient knows to call the clinic with any problems, questions or concerns.    Harriette Ohara, MD 02/08/18

## 2018-02-08 NOTE — Progress Notes (Signed)
Radiation Oncology         [phone] ________________________________  Initial Outpatient Consultation  Name: Julie [name] MRN: [id number]  Date: 02/08/2018  DOB: 1951-11-13  GU:YQIHK, Thayer Jew, MD  Erroll Luna, MD   REFERRING PHYSICIAN: Erroll Luna, MD  DIAGNOSIS:    ICD-10-CM   1. Malignant neoplasm of upper-outer quadrant of left breast in female, estrogen receptor positive ([place]) C50.412    Z17.0    Stage T2 N0 M0 Left Breast UOQ Invasive Mammary Carcinoma, ER+ / PR+ / Her2-, Grade 1 Cancer Staging Malignant neoplasm of upper-outer quadrant of left breast in female, estrogen receptor positive ([place]) Staging form: Breast, AJCC 8th Edition - Clinical stage from 02/08/2018: Stage IB (cT2, cN0, cM0, G1, ER: Positive, PR: Positive, HER2: Negative) - Unsigned   CHIEF COMPLAINT: Here to discuss management of left breast cancer  HISTORY OF PRESENT ILLNESS::Julie [name] is a 67 y.o. female who presented with a 2.5 cm area of architectural distortion in the left breast with new punctate calcifications on screening mammogram on 01/16/18. Ultrasound on 01/25/18 showed a 2.2 cm mass in the left breast upper outer quadrant that is highly suggestive of malignancy.  Biopsy on 02/01/18 showed invasive mammary carcinoma with mammary carcinoma in situ (favor lobular histology for both) with characteristics as described above in the diagnosis. Receptor status is ER 95%, PR 100%, Ki67 1% and Her2 negative. The patient presents to multidisciplinary clinic to discuss the role of radiation as part of her disease management.   Of note, the patient has a family history of breast cancer. Her mother was diagnosed at age 76 and her paternal aunt was diagnosed at age 28. She reports baseline HAs not worse than her usual baseline.  She reports meniscus injury, left knee, and history of DVTs.  Gynecologic History Age of first menstrual period: 57 Still having periods: No, last period  2005 Used hormone replacement: No How many children: 0 Trying to get pregnant: No Used birth control pills or hormone shots: Yes, 10 years  PREVIOUS RADIATION THERAPY: No  PAST MEDICAL HISTORY:  has a past medical history of Depression, DVT (deep venous thrombosis) ([place]), and Torn meniscus.    PAST SURGICAL HISTORY: Past Surgical History:  Procedure Laterality Date  . TONSILLECTOMY    . TUBAL LIGATION      FAMILY HISTORY: family history includes Breast cancer in her mother and paternal aunt.  SOCIAL HISTORY:  reports that she has been smoking cigarettes.  She has been smoking about 1.00 pack per day. she has never used smokeless tobacco. She reports that she drinks alcohol. She reports that she does not use drugs.  ALLERGIES: Patient has no known allergies.  MEDICATIONS:  Current Outpatient Medications  Medication Sig Dispense Refill  . buPROPion (WELLBUTRIN XL) 300 MG 24 hr tablet Take by mouth.    . Rivaroxaban 15 & 20 MG TBPK Take as directed on package: Start with one '15mg'$  tablet by mouth twice a day with food. On Day 22, switch to one '20mg'$  tablet once a day with food. 51 each 0  . Vilazodone HCl (VIIBRYD) 40 MG TABS Take by mouth.     No current facility-administered medications for this encounter.     REVIEW OF SYSTEMS: A 10+ POINT REVIEW OF SYSTEMS WAS OBTAINED including neurology, dermatology, psychiatry, cardiac, respiratory, lymph, extremities, GI, GU, Musculoskeletal, constitutional, breasts, reproductive, HEENT.  All pertinent positives are noted in the HPI.  All others are negative.   PHYSICAL EXAM:  vitals  were not taken for this visit.     General: Alert and oriented, in no acute distress HEENT: Head is normocephalic. Extraocular movements are intact. Oral cavity clear Neck: Neck is supple, no palpable cervical or supraclavicular lymphadenopathy. Heart: Regular in rate and rhythm with no murmurs, rubs, or gallops. Chest: Clear to auscultation bilaterally, with  no rhonchi, wheezes, or rales. Abdomen: Soft, nontender, nondistended, with no rigidity or guarding. Extremities: No cyanosis or edema. Lymphatics: see Neck Exam Skin: No concerning lesions. Musculoskeletal: symmetric strength and muscle tone throughout. Neurologic: Cranial nerves II through XII are grossly intact. No obvious focalities. Speech is fluent. Coordination is intact. Psychiatric: Judgment and insight are intact. Affect is appropriate. Breasts: No palpable masses appreciated in the bilateral breasts or axillae.    ECOG = 1  0 - Asymptomatic (Fully active, able to carry on all predisease activities without restriction)  1 - Symptomatic but completely ambulatory (Restricted in physically strenuous activity but ambulatory and able to carry out work of a light or sedentary nature. For example, light housework, office work)  2 - Symptomatic, <50% in bed during the day (Ambulatory and capable of all self [name] but unable to carry out any work activities. Up and about more than 50% of waking hours)  3 - Symptomatic, >50% in bed, but not bedbound (Capable of only limited self-[name], confined to bed or chair 50% or more of waking hours)  4 - Bedbound (Completely disabled. Cannot carry on any self-[name]. Totally confined to bed or chair)  5 - Death   Eustace Pen MM, Creech RH, Tormey DC, et al. [phone]). "Toxicity and response criteria of the [facility]". [place] Oncol. 5 (6): 649-55   LABORATORY DATA:  Lab Results  Component Value Date   WBC 7.6 02/08/2018   HGB 13.9 06/09/2017   HCT 46.4 02/08/2018   MCV 91.5 02/08/2018   PLT 218 02/08/2018   CMP     Component Value Date/Time   NA 143 02/08/2018 0834   K 4.5 02/08/2018 0834   CL 109 02/08/2018 0834   CO2 28 02/08/2018 0834   GLUCOSE 123 02/08/2018 0834   BUN 14 02/08/2018 0834   CREATININE 1.16 (H) 02/08/2018 0834   CALCIUM 9.8 02/08/2018 0834   PROT 7.3 02/08/2018 0834   ALBUMIN 3.8 02/08/2018  0834   AST 18 02/08/2018 0834   ALT 24 02/08/2018 0834   ALKPHOS 80 02/08/2018 0834   BILITOT 0.7 02/08/2018 0834   GFRNONAA 48 (L) 02/08/2018 0834   GFRAA 56 (L) 02/08/2018 0834         RADIOGRAPHY:  Reviewed in tumor board by me, see above    IMPRESSION/PLAN: Stage T2 N0 M0 Left Breast UOQ Invasive Mammary Carcinoma, ER+ / PR+ / Her2-, Grade 1. The patient will be scheduled for MRI and oncotype testing. She will be scheduled for left breast lumpectomy and sentinel lymph node biopsy. This will be followed by adjuvant radiation therapy to the left breast. She will undergo hormone replacement for at least 5 years.   She has been discussed at our multidisciplinary tumor board.  The consensus is that she would be a good candidate for breast conservation. I talked to her about the option of a mastectomy and informed her that her expected overall survival would be equivalent between mastectomy and breast conservation, based upon randomized controlled data. She is enthusiastic about breast conservation.  It was a pleasure meeting the patient today. We discussed the risks, benefits, and side  effects of radiotherapy. I recommend radiotherapy to the left breast to reduce her risk of locoregional recurrence by 2/3.  We discussed that radiation would take approximately 4 weeks to complete and that I would give the patient a few weeks to heal following surgery before starting treatment planning. If chemotherapy were to be given, this would precede radiotherapy. We spoke about acute effects including skin irritation and fatigue as well as much less common late effects including internal organ injury or irritation. We spoke about the latest technology that is used to minimize the risk of late effects for patients undergoing radiotherapy to the breast or chest wall. No guarantees of treatment were given. The patient is enthusiastic about proceeding with treatment. I look forward to participating in the patient's  [name].  I will await her referral back to me for postoperative follow-up and eventual CT simulation/treatment planning.  She is cutting down on her smoking, smoking cessation was encouraged for the smoothest possible treatment course. She is down to 4 cigarettes a day I talked to her about 1-800-quitnow for support.    __________________________________________   Eppie Gibson, MD  This document serves as a record of services personally performed by Eppie Gibson, MD. It was created on her behalf by Bethann Humble, a trained medical scribe. The creation of this record is based on the scribe's personal observations and the provider's statements to them. This document has been checked and approved by the attending provider.

## 2018-02-08 NOTE — Progress Notes (Signed)
Nutrition Assessment  Reason for Assessment:  Pt seen in Breast Clinic  ASSESSMENT: 67 year old female with new diagnosis of breast cancer.  Past medical history reviewed.   Patient reports good appetite  Medications:  reviewed  Labs: reviewed  Anthropometrics:   Height: 65 inches Weight: 209 lb BMI: 34   NUTRITION DIAGNOSIS: Food and nutrition related knowledge deficit related to new diagnosis of breast cancer as evidenced by no prior need for nutrition related information.  INTERVENTION:   Discussed and provided packet of information regarding nutritional tips for breast cancer patients.  Questions answered.  Teachback method used.  Contact information provided and patient knows to contact me with questions/concerns.    MONITORING, EVALUATION, and GOAL: Pt will consume a healthy plant based diet to maintain lean body mass throughout treatment.   Julie [name] B. Zenia Resides, [place], [place] Registered Dietitian [phone] (pager)

## 2018-02-08 NOTE — Therapy (Signed)
[place], [place], [address] Phone: [phone]   Fax:  [phone]  Physical Therapy Evaluation  Patient Details  Name: Julie [name] MRN: [id number] Date of Birth: [birth year] Referring Provider: Dr. Erroll Luna   Encounter Date: 02/08/2018  PT End of Session - 02/08/18 1133    Visit Number  1    Number of Visits  2    Date for PT Re-Evaluation  04/05/18    PT Start Time  0939    PT Stop Time  0947 Also saw pt from 1049-1105 for a total of 24 minutes    PT Time Calculation (min)  8 min    Activity Tolerance  Patient tolerated treatment well    Behavior During Therapy  [facility] for tasks assessed/performed       Past Medical History:  Diagnosis Date  . Depression   . DVT (deep venous thrombosis) ([place])   . Torn meniscus     Past Surgical History:  Procedure Laterality Date  . TONSILLECTOMY    . TUBAL LIGATION      There were no vitals filed for this visit.   Subjective Assessment - 02/08/18 1126    Subjective  Patient reports she is here today to be seen by her medical team for her newly diagnosed left breast cancer.    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 It measures 2.2 cm and is located in the upper outer quadrant. It is ER/PR positive and HER2 negative with a Ki67 of 1%. She has a left knee meniscal tear that she reports she was planning to have surgery on but that has now been postponed.    Patient Stated Goals  reduce lymphedema risk and learn post op shoulder ROM HEP    Currently in Pain?  Yes    Pain Score  2     Pain Location  Knee    Pain Orientation  Left    Pain Descriptors / Indicators  Aching    Pain Type  Chronic pain    Pain Onset  More than a month ago    Pain Frequency  Intermittent    Aggravating Factors   Walking, squatting    Pain Relieving Factors  Rest    Multiple Pain Sites  No          [facility] PT Assessment - 02/08/18 0001      Assessment   Medical Diagnosis  Left breast cancer    Referring Provider  Dr. Marcello Moores Corn[place]    Onset Date/Surgical Date  01/16/18    Hand Dominance  Right    Prior Therapy  none      Precautions   Precautions  Other (comment)    Precaution Comments  Active cancer      Restrictions   Weight Bearing Restrictions  No      Balance Screen   Has the patient fallen in the past 6 months  No    Has the patient had a decrease in activity level because of a fear of falling?   No    Is the patient reluctant to leave their home because of a fear of falling?   No      Home Environment   Living Environment  Private residence    Living Arrangements  Alone    Available Help at Discharge  Friend(s)      Prior Function   Level of  Independence  Independent    Vocation  Full time employment    Psychologist, sport and exercise for [employer]    Leisure  She does not exercise      Cognition   Overall Cognitive Status  Within Functional Limits for tasks assessed      Posture/Postural Control   Posture/Postural Control  Postural limitations    Postural Limitations  Rounded Shoulders;Forward head      ROM / Strength   AROM / PROM / Strength  AROM;Strength      AROM   Overall AROM Comments  Patient was diagnosed on 01/27/18 with right grade 2 invasive ductal carcinoma breast cancer. It measures 1.6 cm and is located in the right lower outer quadrant. It is ER/PR positive an HER2 negative with a ki67 of 2%. She has no other health problems.    AROM Assessment Site  Shoulder;Cervical    Right/Left Shoulder  Right;Left    Right Shoulder Extension  55 Degrees    Right Shoulder Flexion  154 Degrees    Right Shoulder ABduction  150 Degrees    Right Shoulder Internal Rotation  63 Degrees    Right Shoulder External Rotation  67 Degrees    Left Shoulder Extension  56 Degrees    Left Shoulder Flexion  140 Degrees    Left Shoulder ABduction  150 Degrees     Left Shoulder Internal Rotation  64 Degrees    Left Shoulder External Rotation  80 Degrees    Cervical Flexion  WNL    Cervical Extension  WNL    Cervical - Right Side Bend  WNL    Cervical - Left Side Bend  WNL    Cervical - Right Rotation  25% limited    Cervical - Left Rotation  25% limited      Strength   Overall Strength  Within functional limits for tasks performed        LYMPHEDEMA/ONCOLOGY QUESTIONNAIRE - 02/08/18 1131      Type   Cancer Type  Left breast cancer      Lymphedema Assessments   Lymphedema Assessments  Upper extremities      Right Upper Extremity Lymphedema   10 cm Proximal to Olecranon Process  33 cm    Olecranon Process  26 cm    10 cm Proximal to Ulnar Styloid Process  24.7 cm    Just Proximal to Ulnar Styloid Process  18.4 cm    Across Hand at [employer]  20.4 cm    At [place] of 2nd Digit  6.9 cm      Left Upper Extremity Lymphedema   10 cm Proximal to Olecranon Process  34.5 cm    Olecranon Process  26.1 cm    10 cm Proximal to Ulnar Styloid Process  24.3 cm    Just Proximal to Ulnar Styloid Process  17.5 cm    Across Hand at [employer]  19.7 cm    At [place] of 2nd Digit  6.8 cm          Objective measurements completed on examination: See above findings.    Patient was instructed today in a home exercise program today for post op shoulder range of motion. These included active assist shoulder flexion in sitting, scapular retraction, wall walking with shoulder abduction, and hands behind head external rotation.  She was encouraged to do these twice a day, holding 3 seconds and repeating 5 times when permitted by her physician.  PT Education - 02/08/18 1132    Education provided  Yes    Education Details  Lymphedema risk reduction and post op shoulder ROM HEP    Person(s) Educated  Patient    Methods  Explanation;Demonstration;Handout    Comprehension  Returned demonstration;Verbalized understanding          PT Long  Term Goals - 02/08/18 1137      PT LONG TERM GOAL #1   Title  Patient will demonstrate she has returned to baseline related to shoulder ROM and function.    Time  8    Period  Weeks    Status  New      Breast Clinic Goals - 02/08/18 1137      Patient will be able to verbalize understanding of pertinent lymphedema risk reduction practices relevant to her diagnosis specifically related to skin care.   Time  1    Period  Days    Status  Achieved      Patient will be able to return demonstrate and/or verbalize understanding of the post-op home exercise program related to regaining shoulder range of motion.   Time  1    Period  Days    Status  Achieved      Patient will be able to verbalize understanding of the importance of attending the postoperative After Breast Cancer Class for further lymphedema risk reduction education and therapeutic exercise.   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 It measures 2.2 cm and is located in the upper outer quadrant. It is ER/PR positive and HER2 negative with a Ki67 of 1%. She has a left knee meniscal tear that she reports she was planning to have surgery on but that has now been postponed. Her multidisciplinary medical team met prior to her assessments to determine a recommended treatment plan. She is planning to have a left lumpectomy and sentinel node biopsy followed by Oncotype testing, radiation, and anti-estrogen therapy. She will benefit from a post op PT reassessment to determine needs.    History and Personal Factors relevant to plan of care:  Lives alone    Clinical Presentation  Stable    Clinical Decision Making  Low    Rehab Potential  Excellent    Clinical Impairments Affecting Rehab Potential  None    PT Frequency  -- Eval and 1 f/u visit    PT Treatment/Interventions   ADLs/Self Care Home Management;Therapeutic exercise;Patient/family education    PT Next Visit Plan  Will reassess 3-4 weeks post op    PT Home Exercise Plan  Post op shoulder ROM HEP    Consulted and Agree with Plan of Care  Patient       Patient will benefit from skilled therapeutic intervention in order to improve the following deficits and impairments:  Decreased knowledge of precautions, Impaired UE functional use, Decreased range of motion, Postural dysfunction, Pain  Visit Diagnosis: Malignant neoplasm of upper-outer quadrant of left breast in female, estrogen receptor positive ([place]) - Plan: PT plan of care cert/re-cert  Abnormal posture - Plan: PT plan of care cert/re-cert   Patient will follow up at outpatient cancer rehab 3-4 weeks following surgery.  If the patient requires physical therapy at that time, a specific plan  will be dictated and sent to the referring physician for approval. The patient was educated today on appropriate basic range of motion exercises to begin post operatively and the importance of attending the After Breast Cancer class following surgery.  Patient was educated today on lymphedema risk reduction practices as it pertains to recommendations that will benefit the patient immediately following surgery.  She verbalized good understanding.      Problem List Patient Active Problem List   Diagnosis Date Noted  . Malignant neoplasm of upper-outer quadrant of left breast in female, estrogen receptor positive ([place]) 02/07/2018    Annia Friendly, PT 02/08/18 11:40 AM  [place], [place], [address] Phone: [phone]   Fax:  [phone]  Name: HONESTI SEABERG MRN: [id number] Date of Birth: 1951-06-15

## 2018-02-14 ENCOUNTER — Telehealth: Payer: Self-pay | Admitting: *Deleted

## 2018-02-14 ENCOUNTER — Ambulatory Visit (HOSPITAL_COMMUNITY)
Admission: RE | Admit: 2018-02-14 | Discharge: 2018-02-14 | Disposition: A | Discharge status: Home / Self Care | Payer: 59 | Source: Ambulatory Visit | Attending: Surgery | Admitting: Surgery

## 2018-02-14 DIAGNOSIS — R928 Other abnormal and inconclusive findings on diagnostic imaging of breast: Secondary | ICD-10-CM | POA: Diagnosis not present

## 2018-02-14 DIAGNOSIS — Z170 Estrogen receptor positive status [ER+]: Secondary | ICD-10-CM | POA: Insufficient documentation

## 2018-02-14 DIAGNOSIS — C50412 Malignant neoplasm of upper-outer quadrant of left female breast: Secondary | ICD-10-CM | POA: Insufficient documentation

## 2018-02-14 DIAGNOSIS — K8020 Calculus of gallbladder without cholecystitis without obstruction: Secondary | ICD-10-CM | POA: Insufficient documentation

## 2018-02-14 MED ORDER — GADOBENATE DIMEGLUMINE 529 MG/ML IV SOLN
20.0000 mL | Freq: Once | INTRAVENOUS | Status: AC | PRN
  Administered 2018-02-14: 19 mL via INTRAVENOUS

## 2018-02-14 NOTE — Telephone Encounter (Signed)
Spoke to pt concerning [place] from 3.13.19. Denies questions or needs regarding dx or treatment care plan. Encourage pt to call with needs. Received verbal understanding.

## 2018-02-15 ENCOUNTER — Other Ambulatory Visit: Payer: Self-pay | Admitting: Surgery

## 2018-02-15 DIAGNOSIS — R9389 Abnormal findings on diagnostic imaging of other specified body structures: Secondary | ICD-10-CM

## 2018-02-20 ENCOUNTER — Other Ambulatory Visit: Payer: 59

## 2018-02-23 ENCOUNTER — Ambulatory Visit
Admission: RE | Admit: 2018-02-23 | Discharge: 2018-02-23 | Disposition: A | Discharge status: Home / Self Care | Payer: 59 | Source: Ambulatory Visit | Attending: Surgery | Admitting: Surgery

## 2018-02-23 ENCOUNTER — Other Ambulatory Visit (HOSPITAL_COMMUNITY): Payer: Self-pay | Admitting: Diagnostic Radiology

## 2018-02-23 DIAGNOSIS — R9389 Abnormal findings on diagnostic imaging of other specified body structures: Secondary | ICD-10-CM

## 2018-02-23 MED ORDER — GADOBENATE DIMEGLUMINE 529 MG/ML IV SOLN
19.0000 mL | Freq: Once | INTRAVENOUS | Status: AC | PRN
  Administered 2018-02-23: 19 mL via INTRAVENOUS

## 2018-02-27 ENCOUNTER — Ambulatory Visit: Payer: Self-pay | Admitting: Surgery

## 2018-02-27 DIAGNOSIS — C50912 Malignant neoplasm of unspecified site of left female breast: Secondary | ICD-10-CM

## 2018-02-27 NOTE — H&P (Signed)
Julie [name] Documented: 02/08/2018 7:27 AM Location: [facility] Patient #: [id number] DOB: Dec 15, 1950 Undefined / Language: [name] / Race: White Female   History of Present Illness Julie [name]. [name]; 02/08/2018 11:08 AM) Patient words: Pt sent at the request of Dr Isidore Moos for mammographic abnomality of left breast UOQ. This was not on mammogram and U/S. Area measured 2 .2 cm. Path ILC grade1 ER/PR POS HER 2 NEG. Axilla normal. She has no complaints.                      ADDITIONAL INFORMATION: PROGNOSTIC INDICATORS Results: IMMUNOHISTOCHEMICAL AND MORPHOMETRIC ANALYSIS PERFORMED MANUALLY Estrogen Receptor: 95%, POSITIVE, STRONG STAINING INTENSITY Progesterone Receptor: 100%, POSITIVE, STRONG STAINING INTENSITY Proliferation Marker Ki67: 1% REFERENCE RANGE ESTROGEN RECEPTOR NEGATIVE 0% POSITIVE =>1% REFERENCE RANGE PROGESTERONE RECEPTOR NEGATIVE 0% POSITIVE =>1% All controls stained appropriately Thressa Sheller MD Pathologist, Electronic Signature ( Signed 02/07/2018) FLUORESCENCE IN-SITU HYBRIDIZATION Results: HER2 - NEGATIVE RATIO OF HER2/CEP17 SIGNALS 1.17 AVERAGE HER2 COPY NUMBER PER CELL 2.05 Reference Range: NEGATIVE HER2/CEP17 Ratio <2.0 and average HER2 copy number <4.0 EQUIVOCAL HER2/CEP17 Ratio <2.0 and average HER2 copy number >=4.0 and <6.0 1 of 3 FINAL for [facility], Julie [name] ([id number]) ADDITIONAL INFORMATION:(continued) POSITIVE HER2/CEP17 Ratio >=2.0 or <2.0 and average HER2 copy number >=6.0 Vicente Males MD Pathologist, Electronic Signature ( Signed 02/08/2018) The malignant cells appear negative for e-cadherin, supporting [name] lobular phenotype. Enid Cutter MD Pathologist, Electronic Signature ( Signed 02/02/2018) FINAL DIAGNOSIS Diagnosis Breast, left, needle core biopsy, 2 o'clock - INVASIVE MAMMARY CARCINOMA. - MAMMARY CARCINOMA IN SITU. - SEE COMMENT. Microscopic Comment The carcinoma appears grade I.  E-Cadherin and [name] breast prognostic profile will be performed and the results reported separately. The results were called to [facility] on 02/02/2018. (JBK:ah 02/02/18) Enid Cutter MD Pathologist, Electronic Signature (Case signed 02/02/2018) Specimen Gross and Clinical Information Specimen Comment In formalin @ 10:27 AM; highly suspicious mass Specimen(s) Obtained: Breast, left, needle core biopsy, 2 o'clock Gross Received in formalin labeled with the patient's name and.  The patient is [name] 67 year old female.   Past Surgical History Tawni Pummel, RN; 02/08/2018 7:27 AM) Breast Biopsy  Left. Colon Polyp Removal - Colonoscopy  Foot Surgery  Bilateral. Oral Surgery  Tonsillectomy  Ventral / Umbilical Hernia Surgery  Bilateral.  Diagnostic Studies History Tawni Pummel, RN; 02/08/2018 7:27 AM) Colonoscopy  1-5 years ago Mammogram  within last year Pap Smear  1-5 years ago  Medication History Tawni Pummel, RN; 02/08/2018 7:27 AM) Medications Reconciled  Social History Tawni Pummel, RN; 02/08/2018 7:27 AM) Alcohol use  Occasional alcohol use. Caffeine use  Carbonated beverages, Tea. No drug use  Tobacco use  Current some day smoker.  Family History Tawni Pummel, RN; 02/08/2018 7:27 AM) Alcohol Abuse  Father. Arthritis  Mother. Breast Cancer  Family Members In General, Mother. Cancer  Father. Depression  Father. Diabetes Mellitus  Family Members In General. Heart Disease  Mother. Migraine Headache  Sister. Prostate Cancer  Father. Respiratory Condition  Father.  Pregnancy / Birth History Tawni Pummel, RN; 02/08/2018 7:27 AM) Age at menarche  66 years. Age of menopause  51-55 Contraceptive History  Oral contraceptives. Gravida  1 Irregular periods  Maternal age  10-35 Para  0  Other Problems Tawni Pummel, RN; 02/08/2018 7:27 AM) Arthritis  Back Pain  Bladder Problems  Breast Cancer  Depression   Gastroesophageal Reflux Disease  General anesthesia - complications  Hemorrhoids  Lump In Breast  Pulmonary Embolism /  Blood Clot in Legs  Umbilical Hernia Repair  Vascular Disease     Review of Systems Sunday Spillers Ledford RN; 02/08/2018 7:27 AM) General Not Present- Appetite Loss, Chills, Fatigue, Fever, Night Sweats, Weight Gain and Weight Loss. Skin Present- Dryness. Not Present- Change in Wart/Mole, Hives, Jaundice, New Lesions, Non-Healing Wounds, Rash and Ulcer. HEENT Present- Seasonal Allergies, Sinus Pain and Wears glasses/contact lenses. Not Present- Earache, Hearing Loss, Hoarseness, Nose Bleed, Oral Ulcers, Ringing in the Ears, Sore Throat, Visual Disturbances and Yellow Eyes. Respiratory Present- Snoring. Not Present- Bloody sputum, Chronic Cough, Difficulty Breathing and Wheezing. Breast Not Present- Breast Mass, Breast Pain, Nipple Discharge and Skin Changes. Cardiovascular Present- Swelling of Extremities. Not Present- Chest Pain, Difficulty Breathing Lying Down, Leg Cramps, Palpitations, Rapid Heart Rate and Shortness of Breath. Gastrointestinal Present- Hemorrhoids. Not Present- Abdominal Pain, Bloating, Bloody Stool, Change in Bowel Habits, Chronic diarrhea, Constipation, Difficulty Swallowing, Excessive gas, Gets full quickly at meals, Indigestion, Nausea, Rectal Pain and Vomiting. Female Genitourinary Not Present- Frequency, Nocturia, Painful Urination, Pelvic Pain and Urgency. Musculoskeletal Present- Back Pain, Joint Pain and Muscle Weakness. Not Present- Joint Stiffness, Muscle Pain and Swelling of Extremities. Psychiatric Present- Depression. Not Present- Anxiety, Bipolar, Change in Sleep Pattern, Fearful and Frequent crying. Endocrine Not Present- Cold Intolerance, Excessive Hunger, Hair Changes, Heat Intolerance, Hot flashes and New Diabetes. Hematology Present- Blood Thinners, Easy Bruising and Excessive bleeding. Not Present- Gland problems, HIV and Persistent  Infections.   Physical Exam (Teleah Villamar [name]. Raine Blodgett MD; 02/08/2018 11:09 AM) General Mental Status-Alert. General Appearance-Consistent with stated age. Hydration-Well hydrated. Voice-Normal.  Head and Neck Head-normocephalic, atraumatic with no lesions or palpable masses. Trachea-midline. Thyroid Gland Characteristics - normal size and consistency.  Eye Eyeball - Bilateral-Extraocular movements intact. Sclera/Conjunctiva - Bilateral-No scleral icterus.  Chest and Lung Exam Chest and lung exam reveals -quiet, even and easy respiratory effort with no use of accessory muscles and on auscultation, normal breath sounds, no adventitious sounds and normal vocal resonance. Inspection Chest Wall - Normal. Back - normal.  Breast Breast - Left-Symmetric, Non Tender, No Biopsy scars, no Dimpling, No Inflammation, No Lumpectomy scars, No Mastectomy scars, No Peau d' Orange. Breast - Right-Symmetric, Non Tender, No Biopsy scars, no Dimpling, No Inflammation, No Lumpectomy scars, No Mastectomy scars, No Peau d' Orange. Breast Lump-No Palpable Breast Mass.  Cardiovascular Cardiovascular examination reveals -normal heart sounds, regular rate and rhythm with no murmurs and normal pedal pulses bilaterally.  Neurologic Neurologic evaluation reveals -alert and oriented x 3 with no impairment of recent or remote memory. Mental Status-Normal.  Musculoskeletal Normal Exam - Left-Upper Extremity Strength Normal and Lower Extremity Strength Normal. Normal Exam - Right-Upper Extremity Strength Normal and Lower Extremity Strength Normal.  Lymphatic Head & Neck  General Head & Neck Lymphatics: Bilateral - Description - Normal. Axillary  General Axillary Region: Bilateral - Description - Normal. Tenderness - Non Tender.    Assessment & Plan (Caroleen Stoermer [name]. Lagena Strand MD; 02/08/2018 11:10 AM) BREAST CANCER, LEFT (C50.912)   MRI show multifocal disease and bx proven LN  negative  She will need mastectomy and wants reconstruction  The surgical and non surgical options have been discussed with the patient.  Risks of surgery include bleeding,  Infection,  Flap necrosis,  Tissue loss,  Chronic pain, death, Numbness,  And the need for additional procedures.  Reconstruction options also have been discussed with the patient as well.  The patient agrees to proceed.  Sentinel lymph node mapping and dissection has been discussed with the patient.  Risk of bleeding,  Infection,  Seroma formation,  Additional procedures,,  Shoulder weakness ,  Shoulder stiffness,  Nerve and blood vessel injury and reaction to the mapping dyes have been discussed.  Alternatives to surgery have been discussed with the patient.  The patient agrees to proceed. Impression:Signed by Turner Daniels, MD (02/08/2018 11:11 AM)

## 2018-03-03 ENCOUNTER — Telehealth: Payer: Self-pay | Admitting: *Deleted

## 2018-03-03 NOTE — Telephone Encounter (Signed)
Left vm requesting return call to discuss Letrozole. Contact information provided.

## 2018-03-06 ENCOUNTER — Other Ambulatory Visit: Payer: Self-pay | Admitting: *Deleted

## 2018-03-06 MED ORDER — LETROZOLE 2.5 MG PO TABS: 2.5000 mg | ORAL_TABLET | Freq: Every day | ORAL | 6 refills | Status: DC

## 2018-03-06 NOTE — Telephone Encounter (Signed)
Spoke to pt regarding starting letrozole until surgery. Gave instructions. Denies questions regarding medication. Pt will see plastic surgeon on 03/08/18.

## 2018-03-14 ENCOUNTER — Telehealth: Payer: Self-pay | Admitting: Hematology and Oncology

## 2018-03-14 NOTE — Telephone Encounter (Signed)
Left message for patient regarding upcoming June appointments per 4/15 sch message

## 2018-03-27 ENCOUNTER — Ambulatory Visit: Payer: Self-pay | Admitting: Surgery

## 2018-03-27 DIAGNOSIS — C50912 Malignant neoplasm of unspecified site of left female breast: Secondary | ICD-10-CM | POA: Diagnosis not present

## 2018-03-27 NOTE — H&P (Signed)
Julie [name] Documented: 03/27/2018 2:42 PM Location: [facility] Patient #: [id number] DOB: 1951/05/16 Undefined / Language: Julie [name] / Race: White Female  History of Present Illness Julie Moores A. Julie Shetley MD; 03/27/2018 4:11 PM) Patient words: Patient returns for follow-up of her left breast cancer. MRI showed multiply quadrant disease and second biopsy showed additional invasive disease. She would now like to consider bilateral nipple sparing mastectomy after discussion with her family and doing her own research.  The patient is a 67 year old female.   Allergies Sabino Gasser; 03/27/2018 2:42 PM) No Known Drug Allergies [03/27/2018]: Allergies Reconciled  Medication History Sabino Gasser; 03/27/2018 2:43 PM) Xarelto (10MG  Tablet, Oral) Active. BuPROPion HCl ER (XL) (300MG  Tablet ER 24HR, Oral) Active. Rivaroxaban (15 & 20MG  Tab Ther Pack, Oral) Active. Medications Reconciled    Vitals Sabino Gasser; 03/27/2018 2:44 PM) 03/27/2018 2:43 PM Weight: 212.13 lb Height: 65in Body Surface Area: 2.03 m Body Mass Index: 35.3 kg/m  Temp.: 98.4F(Oral)  Pulse: 96 (Regular)  BP: 132/72 (Sitting, Left Arm, Standard)      Physical Exam (Julie [name] A. Julie Abdon MD; 03/27/2018 4:11 PM)  Breast Note: Left breast shows no change except biopsy change. Right breast normal. No palpable masslike feel  Cardiovascular Cardiovascular examination reveals -on palpation PMI is normal in location and amplitude, no palpable S3 or S4. Normal cardiac borders., normal heart sounds, regular rate and rhythm with no murmurs, carotid auscultation reveals no bruits and normal pedal pulses bilaterally.    Assessment & Plan (Julie [name] A. Julie Fendley MD; 03/27/2018 4:12 PM)  BREAST CANCER, LEFT (C50.912) Impression: Multifocal  Patient is opted for bilateral nipple sparing mastectomy if possible. She is seen plastic surgery and given her size she is at the border of this being possible.  I will discuss with plastic surgery but she would like to undergo bilateral mastectomies with reconstruction. Risks, benefits and other options discussed. Also discussed and the lymph node mapping in this circumstance. Discussed treatment options for breast cancer to include breast conservation vs mastectomy with reconstruction. Pt has decided on mastectomy. Risk include bleeding, infection, flap necrosis, pain, numbness, recurrence, hematoma, other surgery needs. Pt understands and agrees to proceed. Risk of sentinel lymph node mapping include bleeding, infection, lymphedema, shoulder pain. stiffness, dye allergy. cosmetic deformity , blood clots, death, need for more surgery. Pt agres to proceed. agrees to proceed.  Current Plans Pt Education - ABC (After Breast Cancer) Class Info: discussed with patient and provided information. Pt Education - CCS Mastectomy HCI Pt Education - flb breast cancer surgery: discussed with patient and provided information. You are being scheduled for surgery- Our schedulers will call you.  You should hear from our office's scheduling department within 5 working days about the location, date, and time of surgery. We try to make accommodations for patient's preferences in scheduling surgery, but sometimes the OR schedule or the surgeon's schedule prevents [place] from making those accommodations.  If you have not heard from our office [phone]) in 5 working days, call the office and ask for your surgeon's nurse.  If you have other questions about your diagnosis, plan, or surgery, call the office and ask for your surgeon's nurse.

## 2018-03-28 ENCOUNTER — Telehealth: Payer: Self-pay

## 2018-03-28 NOTE — Telephone Encounter (Signed)
Returned pt call regarding appt after surgery with Dr. Lindi Adie on June 7th, confirmed appt and informed pt final pathology report would be went over at that time.  Cyndia Bent RN

## 2018-04-06 NOTE — H&P (Signed)
Subjective:     Patient ID: Julie [name] is a 68 y.o. female.  HPI  Here for follow up discussion breast reconstruction prior to planned bilateral masectomies. Presented following screening MMG showing distortion in the left breast with calcifications in the UOQ.  USshowed 2.2 cm mass over UOQ and benign axilla. Biopsy with ILC with LCIS ER/PR+, Her2-.  MRI showed a 2.0 x 1.5 x 1.3 cm biopsy-proven carcinoma over UOQ. In addition multiple areas of clumped, linear enhancement involving all 4 quadrants of the left breast spanning an area measuring 13.5 x 9.0 x 4.3 cm noted. On MRI, left axillary LN with cortical thickening noted. MR guided biopsy of the linear enhancement labeled OLQ with invasive carcinoma and ALH. Second-look left axillary [place] and biopsy negative for carcinoma.  Given span of disease mastectomy recommended, patient has elected for bilateral. Plan oncotype on surgical specimen. Letrozole started preoperatively.  History DVT last 8.2018, prior to that states 10 years prior. No hypercoaguability work up recalled by patient. On Xarelto. Has used Lovenox in past.  Current  42 C, happy with this. Wt up 10 lb over last 6 months.   States last cig was day of consult with me.   Lives alone. Works as Production assistant, radio at [employer]. States had company to send FMLA to me, desires 6 weeks off.  Review of Systems     Objective:   Physical Exam  Cardiovascular: Normal rate and normal heart sounds.   Pulmonary/Chest: Effort normal and breath sounds normal.  Abdominal:  No hernias  Lymphadenopathy:    She has no axillary adenopathy.  Skin:  Fitzpatrick 2  no masses palpable Grade 1 ptosis bilateral   SN to nipple R 28 L 28 cm BW R 20 L 20 (CW 14 cm) Nipple to IMF R 12.5 L 12. 5  Assessment:     Left breast ca UOQ ER+     Plan:     Congratulated her on not smoking.  Reviewed she is higher risk bleeding complications post op.Plan  Lovenox in  perioperative period for at leas 10 d post. She is to hold Xarelto 3 d prior.  Reviewed SRM vs NSM, both will be asensate and not stimulate. Reviewed with both risks mastectomy flap necrosis requiring additional surgery. Recommend skin reduction mastectomy given ptosis. Reviewed anchor type scars. She is agreeable to this.  Discussed process of expansion and implant based risks including rupture, surveillance for silicone implants, infection requiring surgery or removal, contracture. Reviewed reconstruction will be asensate and not stimulate. Reviewed with risks mastectomy flap necrosis requiring additional surgery.  Discussed use of acellular dermis in reconstruction, cadaveric source, incorporation over several weeks, risk that if has seroma or infection can act as additional nidus for infection if not incorporated.  Discussed prepectoral vs sub pectoral reconstruction. Discussed with patient and benefit of this is no animation deformity, may be less pain. Risk may be more visible rippling over upper poles, greater need of ADM. Reviewed pre pectoral would require larger amount acellular dermis, more drains. Discussed any type reconstruction also risks long term displacement implant and visible rippling. If prepectoral counseled I would recommend she be comfortable with silicone implants as more options that have less rippling. She agrees to prepectoral placement.  Reviewed reconstruction will be asensate and not stimulate. Reviewed additional risks including but not limited to risks mastectomy flap necrosis requiring additional surgery, seroma, hematoma, asymmetry, need to additional procedures, fat necrosis, DVT/PE, damage to adjacent structures, cardiopulmonary complications.  Arnoldo Hooker  [place], [facility] [phone], pin [phone]

## 2018-04-12 DIAGNOSIS — R0602 Shortness of breath: Secondary | ICD-10-CM | POA: Diagnosis not present

## 2018-04-12 DIAGNOSIS — R002 Palpitations: Secondary | ICD-10-CM | POA: Diagnosis not present

## 2018-04-12 DIAGNOSIS — Z01810 Encounter for preprocedural cardiovascular examination: Secondary | ICD-10-CM | POA: Diagnosis not present

## 2018-04-12 DIAGNOSIS — Z0189 Encounter for other specified special examinations: Secondary | ICD-10-CM | POA: Diagnosis not present

## 2018-04-14 DIAGNOSIS — R0602 Shortness of breath: Secondary | ICD-10-CM | POA: Diagnosis not present

## 2018-04-14 DIAGNOSIS — Z0189 Encounter for other specified special examinations: Secondary | ICD-10-CM | POA: Diagnosis not present

## 2018-04-14 DIAGNOSIS — R002 Palpitations: Secondary | ICD-10-CM | POA: Diagnosis not present

## 2018-04-17 DIAGNOSIS — R0602 Shortness of breath: Secondary | ICD-10-CM | POA: Diagnosis not present

## 2018-04-17 DIAGNOSIS — R002 Palpitations: Secondary | ICD-10-CM | POA: Diagnosis not present

## 2018-04-17 DIAGNOSIS — Z01810 Encounter for preprocedural cardiovascular examination: Secondary | ICD-10-CM | POA: Diagnosis not present

## 2018-04-20 ENCOUNTER — Other Ambulatory Visit: Payer: Self-pay

## 2018-04-20 ENCOUNTER — Encounter (HOSPITAL_BASED_OUTPATIENT_CLINIC_OR_DEPARTMENT_OTHER): Payer: Self-pay | Admitting: *Deleted

## 2018-04-21 ENCOUNTER — Encounter (HOSPITAL_BASED_OUTPATIENT_CLINIC_OR_DEPARTMENT_OTHER)
Admission: RE | Admit: 2018-04-21 | Discharge: 2018-04-21 | Disposition: A | Discharge status: Home / Self Care | Payer: 59 | Source: Ambulatory Visit | Attending: Surgery | Admitting: Surgery

## 2018-04-21 DIAGNOSIS — Z01812 Encounter for preprocedural laboratory examination: Secondary | ICD-10-CM | POA: Insufficient documentation

## 2018-04-21 DIAGNOSIS — C50912 Malignant neoplasm of unspecified site of left female breast: Secondary | ICD-10-CM | POA: Diagnosis not present

## 2018-04-21 LAB — CBC WITH DIFFERENTIAL/PLATELET
Abs Immature Granulocytes: 0 10*3/uL (ref 0.0–0.1)
BASOS PCT: 1 %
Basophils Absolute: 0.1 10*3/uL (ref 0.0–0.1)
EOS ABS: 0.2 10*3/uL (ref 0.0–0.7)
Eosinophils Relative: 2 %
HEMATOCRIT: 44.7 % (ref 36.0–46.0)
Hemoglobin: 14.1 g/dL (ref 12.0–15.0)
IMMATURE GRANULOCYTES: 1 %
LYMPHS ABS: 2.1 10*3/uL (ref 0.7–4.0)
Lymphocytes Relative: 27 %
MCH: 29.5 pg (ref 26.0–34.0)
MCHC: 31.5 g/dL (ref 30.0–36.0)
MCV: 93.5 fL (ref 78.0–100.0)
Monocytes Absolute: 0.6 10*3/uL (ref 0.1–1.0)
Monocytes Relative: 8 %
NEUTROS PCT: 61 %
Neutro Abs: 4.8 10*3/uL (ref 1.7–7.7)
PLATELETS: 221 10*3/uL (ref 150–400)
RBC: 4.78 MIL/uL (ref 3.87–5.11)
RDW: 13.7 % (ref 11.5–15.5)
WBC: 7.8 10*3/uL (ref 4.0–10.5)

## 2018-04-21 LAB — COMPREHENSIVE METABOLIC PANEL
ALT: 20 U/L (ref 14–54)
AST: 16 U/L (ref 15–41)
Albumin: 3.9 g/dL (ref 3.5–5.0)
Alkaline Phosphatase: 70 U/L (ref 38–126)
Anion gap: 7 (ref 5–15)
BUN: 13 mg/dL (ref 6–20)
CO2: 28 mmol/L (ref 22–32)
Calcium: 9.3 mg/dL (ref 8.9–10.3)
Chloride: 109 mmol/L (ref 101–111)
Creatinine, Ser: 1.21 mg/dL — ABNORMAL HIGH (ref 0.44–1.00)
GFR calc Af Amer: 53 mL/min — ABNORMAL LOW (ref >60)
GFR calc non Af Amer: 46 mL/min — ABNORMAL LOW (ref >60)
Glucose, Bld: 102 mg/dL — ABNORMAL HIGH (ref 65–99)
Potassium: 4.7 mmol/L (ref 3.5–5.1)
Sodium: 144 mmol/L (ref 135–145)
Total Bilirubin: 0.7 mg/dL (ref 0.3–1.2)
Total Protein: 7 g/dL (ref 6.5–8.1)

## 2018-04-21 NOTE — Progress Notes (Signed)
Pt arrived for blood work. Given hibiclens soap with instruction to shower with half night before and remaining half morning of surgery. Given ensure presurgery drink (with instruction taped to bottle) and instructed to drink DOS prior to 0400. Pt verbalized understanding of all instructions. Pt tearful during conversation. Explained what to expect DOS and provided emotional support.

## 2018-04-26 NOTE — Anesthesia Preprocedure Evaluation (Addendum)
Anesthesia Evaluation  Patient identified by MRN, date of birth, ID band Patient awake    Reviewed: Allergy & Precautions, NPO status , Patient's Chart, lab work & pertinent test results  History of Anesthesia Complications (+) PONV and history of anesthetic complications  Airway Mallampati: II  TM Distance: >3 FB Neck ROM: Full    Dental  (+) Dental Advisory Given   Pulmonary former smoker,    Pulmonary exam normal breath sounds clear to auscultation       Cardiovascular negative cardio ROS Normal cardiovascular exam Rhythm:Regular Rate:Normal     Neuro/Psych PSYCHIATRIC DISORDERS Depression negative neurological ROS     GI/Hepatic negative GI ROS, Neg liver ROS,   Endo/Other  negative endocrine ROS  Renal/GU negative Renal ROS     Musculoskeletal negative musculoskeletal ROS (+)   Abdominal   Peds  Hematology negative hematology ROS (+)   Anesthesia Other Findings   Reproductive/Obstetrics negative OB ROS                            Anesthesia Physical Anesthesia Plan  ASA: II  Anesthesia Plan: General   Post-op Pain Management: GA combined w/ Regional for post-op pain   Induction: Intravenous  PONV Risk Score and Plan: 4 or greater and Ondansetron, Dexamethasone, Scopolamine patch - Pre-op and Midazolam  Airway Management Planned: Oral ETT  Additional Equipment:   Intra-op Plan:   Post-operative Plan: Extubation in OR  Informed Consent: I have reviewed the patients History and Physical, chart, labs and discussed the procedure including the risks, benefits and alternatives for the proposed anesthesia with the patient or authorized representative who has indicated his/her understanding and acceptance.   Dental advisory given  Plan Discussed with: CRNA  Anesthesia Plan Comments:         Anesthesia Quick Evaluation

## 2018-04-27 ENCOUNTER — Other Ambulatory Visit: Payer: Self-pay

## 2018-04-27 ENCOUNTER — Ambulatory Visit (HOSPITAL_BASED_OUTPATIENT_CLINIC_OR_DEPARTMENT_OTHER): Payer: 59 | Admitting: Anesthesiology

## 2018-04-27 ENCOUNTER — Encounter (HOSPITAL_BASED_OUTPATIENT_CLINIC_OR_DEPARTMENT_OTHER): Payer: Self-pay | Admitting: *Deleted

## 2018-04-27 ENCOUNTER — Encounter (HOSPITAL_COMMUNITY)
Admission: RE | Admit: 2018-04-27 | Discharge: 2018-04-27 | Disposition: A | Discharge status: Home / Self Care | Payer: 59 | Source: Ambulatory Visit | Attending: Surgery | Admitting: Surgery

## 2018-04-27 ENCOUNTER — Encounter (HOSPITAL_BASED_OUTPATIENT_CLINIC_OR_DEPARTMENT_OTHER)
Admission: RE | Disposition: A | Discharge status: Home / Self Care | Payer: Self-pay | Source: Ambulatory Visit | Attending: Surgery

## 2018-04-27 ENCOUNTER — Ambulatory Visit (HOSPITAL_BASED_OUTPATIENT_CLINIC_OR_DEPARTMENT_OTHER)
Admission: RE | Admit: 2018-04-27 | Discharge: 2018-04-28 | Disposition: A | Discharge status: Home / Self Care | Payer: 59 | Source: Ambulatory Visit | Attending: Surgery | Admitting: Surgery

## 2018-04-27 DIAGNOSIS — N62 Hypertrophy of breast: Secondary | ICD-10-CM | POA: Insufficient documentation

## 2018-04-27 DIAGNOSIS — N6022 Fibroadenosis of left breast: Secondary | ICD-10-CM | POA: Diagnosis not present

## 2018-04-27 DIAGNOSIS — Z170 Estrogen receptor positive status [ER+]: Secondary | ICD-10-CM | POA: Insufficient documentation

## 2018-04-27 DIAGNOSIS — Z87891 Personal history of nicotine dependence: Secondary | ICD-10-CM | POA: Insufficient documentation

## 2018-04-27 DIAGNOSIS — F329 Major depressive disorder, single episode, unspecified: Secondary | ICD-10-CM | POA: Diagnosis not present

## 2018-04-27 DIAGNOSIS — N6011 Diffuse cystic mastopathy of right breast: Secondary | ICD-10-CM | POA: Insufficient documentation

## 2018-04-27 DIAGNOSIS — C50912 Malignant neoplasm of unspecified site of left female breast: Secondary | ICD-10-CM | POA: Diagnosis present

## 2018-04-27 DIAGNOSIS — C50412 Malignant neoplasm of upper-outer quadrant of left female breast: Secondary | ICD-10-CM | POA: Diagnosis present

## 2018-04-27 DIAGNOSIS — N6012 Diffuse cystic mastopathy of left breast: Secondary | ICD-10-CM | POA: Diagnosis not present

## 2018-04-27 DIAGNOSIS — Z79899 Other long term (current) drug therapy: Secondary | ICD-10-CM | POA: Insufficient documentation

## 2018-04-27 DIAGNOSIS — D4861 Neoplasm of uncertain behavior of right breast: Secondary | ICD-10-CM | POA: Diagnosis not present

## 2018-04-27 DIAGNOSIS — Z7901 Long term (current) use of anticoagulants: Secondary | ICD-10-CM | POA: Diagnosis not present

## 2018-04-27 HISTORY — PX: MASTECTOMY W/ SENTINEL NODE BIOPSY: SHX2001

## 2018-04-27 HISTORY — PX: BREAST RECONSTRUCTION WITH PLACEMENT OF TISSUE EXPANDER AND FLEX HD (ACELLULAR HYDRATED DERMIS): SHX6295

## 2018-04-27 SURGERY — MASTECTOMY WITH SENTINEL LYMPH NODE BIOPSY
Anesthesia: General | Site: Breast | Laterality: Bilateral

## 2018-04-27 MED ORDER — OXYCODONE HCL 5 MG PO TABS
5.0000 mg | ORAL_TABLET | ORAL | Status: DC | PRN
  Administered 2018-04-27 – 2018-04-28 (×4): 5 mg via ORAL
  Filled 2018-04-27 (×3): qty 1
  Filled 2018-04-27: qty 2

## 2018-04-27 MED ORDER — MIDAZOLAM HCL 2 MG/2ML IJ SOLN
INTRAMUSCULAR | Status: AC
  Filled 2018-04-27: qty 2

## 2018-04-27 MED ORDER — CEFAZOLIN SODIUM-DEXTROSE 1-4 GM/50ML-% IV SOLN
1.0000 g | Freq: Three times a day (TID) | INTRAVENOUS | Status: AC
  Administered 2018-04-27 – 2018-04-28 (×3): 1 g via INTRAVENOUS
  Filled 2018-04-27 (×3): qty 50

## 2018-04-27 MED ORDER — KETOROLAC TROMETHAMINE 15 MG/ML IJ SOLN
15.0000 mg | INTRAMUSCULAR | Status: AC
  Administered 2018-04-27: 15 mg via INTRAVENOUS

## 2018-04-27 MED ORDER — SULFAMETHOXAZOLE-TRIMETHOPRIM 800-160 MG PO TABS: 1.0000 | ORAL_TABLET | Freq: Two times a day (BID) | ORAL | 0 refills | Status: DC

## 2018-04-27 MED ORDER — KETOROLAC TROMETHAMINE 15 MG/ML IJ SOLN
INTRAMUSCULAR | Status: AC
  Filled 2018-04-27: qty 1

## 2018-04-27 MED ORDER — MEPERIDINE HCL 25 MG/ML IJ SOLN: 6.2500 mg | INTRAMUSCULAR | Status: DC | PRN

## 2018-04-27 MED ORDER — SUGAMMADEX SODIUM 200 MG/2ML IV SOLN
INTRAVENOUS | Status: AC
  Filled 2018-04-27: qty 2

## 2018-04-27 MED ORDER — CEFAZOLIN SODIUM-DEXTROSE 2-4 GM/100ML-% IV SOLN
INTRAVENOUS | Status: AC
  Filled 2018-04-27: qty 100

## 2018-04-27 MED ORDER — SUGAMMADEX SODIUM 200 MG/2ML IV SOLN
INTRAVENOUS | Status: DC | PRN
  Administered 2018-04-27: 200 mg via INTRAVENOUS

## 2018-04-27 MED ORDER — KETOROLAC TROMETHAMINE 30 MG/ML IJ SOLN
INTRAMUSCULAR | Status: AC
  Filled 2018-04-27: qty 1

## 2018-04-27 MED ORDER — HYDROMORPHONE HCL 1 MG/ML IJ SOLN: 0.5000 mg | INTRAMUSCULAR | Status: DC | PRN

## 2018-04-27 MED ORDER — ACETAMINOPHEN 500 MG PO TABS
1000.0000 mg | ORAL_TABLET | ORAL | Status: AC
  Administered 2018-04-27: 1000 mg via ORAL

## 2018-04-27 MED ORDER — VILAZODONE HCL 40 MG PO TABS: 20.0000 mg | ORAL_TABLET | Freq: Every day | ORAL | Status: DC

## 2018-04-27 MED ORDER — FENTANYL CITRATE (PF) 100 MCG/2ML IJ SOLN
INTRAMUSCULAR | Status: AC
  Filled 2018-04-27: qty 2

## 2018-04-27 MED ORDER — ACETAMINOPHEN 500 MG PO TABS
ORAL_TABLET | ORAL | Status: AC
  Filled 2018-04-27: qty 2

## 2018-04-27 MED ORDER — GABAPENTIN 300 MG PO CAPS
300.0000 mg | ORAL_CAPSULE | ORAL | Status: AC
  Administered 2018-04-27: 300 mg via ORAL

## 2018-04-27 MED ORDER — CELECOXIB 400 MG PO CAPS: 400.0000 mg | ORAL_CAPSULE | ORAL | Status: DC

## 2018-04-27 MED ORDER — ENOXAPARIN SODIUM 100 MG/ML ~~LOC~~ SOLN: 1.0000 mg/kg | SUBCUTANEOUS | 0 refills | Status: DC

## 2018-04-27 MED ORDER — KETOROLAC TROMETHAMINE 15 MG/ML IJ SOLN
15.0000 mg | Freq: Four times a day (QID) | INTRAMUSCULAR | Status: AC
  Administered 2018-04-27 (×2): 15 mg via INTRAVENOUS
  Filled 2018-04-27 (×2): qty 1

## 2018-04-27 MED ORDER — PHENYLEPHRINE HCL 10 MG/ML IJ SOLN
INTRAMUSCULAR | Status: AC
  Filled 2018-04-27: qty 1

## 2018-04-27 MED ORDER — ONDANSETRON HCL 4 MG/2ML IJ SOLN: 4.0000 mg | Freq: Four times a day (QID) | INTRAMUSCULAR | Status: DC | PRN

## 2018-04-27 MED ORDER — CLONIDINE HCL (ANALGESIA) 100 MCG/ML EP SOLN
EPIDURAL | Status: DC | PRN
  Administered 2018-04-27: 80 ug

## 2018-04-27 MED ORDER — GABAPENTIN 300 MG PO CAPS
ORAL_CAPSULE | ORAL | Status: AC
  Filled 2018-04-27: qty 1

## 2018-04-27 MED ORDER — HEPARIN SODIUM (PORCINE) 5000 UNIT/ML IJ SOLN
5000.0000 [IU] | INTRAMUSCULAR | Status: AC
  Administered 2018-04-27: 5000 [IU] via SUBCUTANEOUS

## 2018-04-27 MED ORDER — ONDANSETRON HCL 4 MG/2ML IJ SOLN
INTRAMUSCULAR | Status: AC
  Filled 2018-04-27: qty 2

## 2018-04-27 MED ORDER — TECHNETIUM TC 99M SULFUR COLLOID FILTERED
1.0000 | Freq: Once | INTRAVENOUS | Status: AC | PRN
  Administered 2018-04-27: 1 via INTRADERMAL

## 2018-04-27 MED ORDER — DEXAMETHASONE SODIUM PHOSPHATE 10 MG/ML IJ SOLN
INTRAMUSCULAR | Status: AC
  Filled 2018-04-27: qty 1

## 2018-04-27 MED ORDER — PROMETHAZINE HCL 25 MG/ML IJ SOLN: 6.2500 mg | INTRAMUSCULAR | Status: DC | PRN

## 2018-04-27 MED ORDER — GLYCOPYRROLATE 0.2 MG/ML IJ SOLN
INTRAMUSCULAR | Status: DC | PRN
  Administered 2018-04-27: 0.2 mg via INTRAVENOUS

## 2018-04-27 MED ORDER — PROPOFOL 10 MG/ML IV BOLUS
INTRAVENOUS | Status: DC | PRN
  Administered 2018-04-27: 150 mg via INTRAVENOUS

## 2018-04-27 MED ORDER — DIPHENHYDRAMINE HCL 50 MG/ML IJ SOLN: 12.5000 mg | Freq: Four times a day (QID) | INTRAMUSCULAR | Status: DC | PRN

## 2018-04-27 MED ORDER — FENTANYL CITRATE (PF) 100 MCG/2ML IJ SOLN
50.0000 ug | INTRAMUSCULAR | Status: AC | PRN
  Administered 2018-04-27 (×2): 50 ug via INTRAVENOUS
  Administered 2018-04-27: 100 ug via INTRAVENOUS

## 2018-04-27 MED ORDER — LACTATED RINGERS IV SOLN
INTRAVENOUS | Status: DC
  Administered 2018-04-27: 07:00:00 via INTRAVENOUS

## 2018-04-27 MED ORDER — LIDOCAINE HCL (CARDIAC) PF 100 MG/5ML IV SOSY
PREFILLED_SYRINGE | INTRAVENOUS | Status: DC | PRN
  Administered 2018-04-27: 50 mg via INTRAVENOUS

## 2018-04-27 MED ORDER — CHLORHEXIDINE GLUCONATE CLOTH 2 % EX PADS: 6.0000 | MEDICATED_PAD | Freq: Once | CUTANEOUS | Status: DC

## 2018-04-27 MED ORDER — PHENYLEPHRINE HCL 10 MG/ML IJ SOLN
INTRAVENOUS | Status: DC | PRN
  Administered 2018-04-27: 50 ug/min via INTRAVENOUS

## 2018-04-27 MED ORDER — ROPIVACAINE HCL 7.5 MG/ML IJ SOLN
INTRAMUSCULAR | Status: DC | PRN
  Administered 2018-04-27: 40 mL via PERINEURAL

## 2018-04-27 MED ORDER — MIDAZOLAM HCL 2 MG/2ML IJ SOLN
1.0000 mg | INTRAMUSCULAR | Status: DC | PRN
  Administered 2018-04-27: 2 mg via INTRAVENOUS

## 2018-04-27 MED ORDER — ROCURONIUM BROMIDE 100 MG/10ML IV SOLN
INTRAVENOUS | Status: DC | PRN
  Administered 2018-04-27: 50 mg via INTRAVENOUS

## 2018-04-27 MED ORDER — OXYCODONE HCL 5 MG PO TABS: 5.0000 mg | ORAL_TABLET | ORAL | 0 refills | Status: DC | PRN

## 2018-04-27 MED ORDER — METHOCARBAMOL 500 MG PO TABS: 500.0000 mg | ORAL_TABLET | Freq: Three times a day (TID) | ORAL | 0 refills | Status: DC | PRN

## 2018-04-27 MED ORDER — ROCURONIUM BROMIDE 50 MG/5ML IV SOLN
INTRAVENOUS | Status: AC
  Filled 2018-04-27: qty 1

## 2018-04-27 MED ORDER — FENTANYL CITRATE (PF) 100 MCG/2ML IJ SOLN: 25.0000 ug | INTRAMUSCULAR | Status: DC | PRN

## 2018-04-27 MED ORDER — DIPHENHYDRAMINE HCL 12.5 MG/5ML PO ELIX: 12.5000 mg | ORAL_SOLUTION | Freq: Four times a day (QID) | ORAL | Status: DC | PRN

## 2018-04-27 MED ORDER — LIDOCAINE HCL (CARDIAC) PF 100 MG/5ML IV SOSY
PREFILLED_SYRINGE | INTRAVENOUS | Status: AC
  Filled 2018-04-27: qty 5

## 2018-04-27 MED ORDER — DEXAMETHASONE SODIUM PHOSPHATE 4 MG/ML IJ SOLN
INTRAMUSCULAR | Status: DC | PRN
  Administered 2018-04-27: 10 mg via INTRAVENOUS

## 2018-04-27 MED ORDER — CELECOXIB 200 MG PO CAPS
ORAL_CAPSULE | ORAL | Status: AC
  Filled 2018-04-27: qty 2

## 2018-04-27 MED ORDER — METHOCARBAMOL 500 MG PO TABS
500.0000 mg | ORAL_TABLET | Freq: Three times a day (TID) | ORAL | Status: DC | PRN
  Administered 2018-04-28: 500 mg via ORAL
  Filled 2018-04-27: qty 1

## 2018-04-27 MED ORDER — SCOPOLAMINE 1 MG/3DAYS TD PT72
1.0000 | MEDICATED_PATCH | Freq: Once | TRANSDERMAL | Status: DC | PRN
Start: 2018-04-27 — End: 2018-04-28

## 2018-04-27 MED ORDER — ONDANSETRON 4 MG PO TBDP: 4.0000 mg | ORAL_TABLET | Freq: Four times a day (QID) | ORAL | Status: DC | PRN

## 2018-04-27 MED ORDER — HEPARIN SODIUM (PORCINE) 5000 UNIT/ML IJ SOLN
INTRAMUSCULAR | Status: AC
  Filled 2018-04-27: qty 1

## 2018-04-27 MED ORDER — CEFAZOLIN SODIUM 10 G IJ SOLR
3.0000 g | INTRAMUSCULAR | Status: AC
  Administered 2018-04-27 (×2): 2 g via INTRAVENOUS

## 2018-04-27 MED ORDER — ENOXAPARIN SODIUM 40 MG/0.4ML ~~LOC~~ SOLN
40.0000 mg | SUBCUTANEOUS | Status: DC
  Filled 2018-04-27: qty 0.4

## 2018-04-27 MED ORDER — BUPROPION HCL ER (XL) 300 MG PO TB24: 300.0000 mg | ORAL_TABLET | Freq: Every day | ORAL | Status: DC

## 2018-04-27 MED ORDER — PROPOFOL 500 MG/50ML IV EMUL
INTRAVENOUS | Status: AC
  Filled 2018-04-27: qty 50

## 2018-04-27 MED ORDER — GABAPENTIN 300 MG PO CAPS
300.0000 mg | ORAL_CAPSULE | Freq: Two times a day (BID) | ORAL | Status: DC
  Administered 2018-04-28: 300 mg via ORAL

## 2018-04-27 MED ORDER — POVIDONE-IODINE 10 % EX SOLN
CUTANEOUS | Status: DC | PRN
  Administered 2018-04-27: 1 via TOPICAL

## 2018-04-27 MED ORDER — ONDANSETRON HCL 4 MG/2ML IJ SOLN
INTRAMUSCULAR | Status: DC | PRN
  Administered 2018-04-27: 4 mg via INTRAVENOUS

## 2018-04-27 MED ORDER — KCL IN DEXTROSE-NACL 20-5-0.45 MEQ/L-%-% IV SOLN
INTRAVENOUS | Status: DC
  Administered 2018-04-27: 15:00:00 via INTRAVENOUS
  Filled 2018-04-27 (×2): qty 1000

## 2018-04-27 MED ORDER — CEFAZOLIN SODIUM 1 G IJ SOLR
INTRAMUSCULAR | Status: AC
  Filled 2018-04-27: qty 20

## 2018-04-27 MED ORDER — SODIUM CHLORIDE 0.9 % IV SOLN
INTRAVENOUS | Status: DC | PRN
  Administered 2018-04-27: 10:00:00

## 2018-04-27 SURGICAL SUPPLY — 103 items
ALLODERM 8X16 MED THICK (Tissue) ×4 IMPLANT
ALLOGRAFT PERF 16X20 1.6+/-0.4 (Tissue) ×1 IMPLANT
APPLIER CLIP 11 MED OPEN (CLIP) ×2
APPLIER CLIP 9.375 MED OPEN (MISCELLANEOUS)
BAG DECANTER FOR FLEXI CONT (MISCELLANEOUS) ×2 IMPLANT
BANDAGE ACE 6X5 VEL STRL LF (GAUZE/BANDAGES/DRESSINGS) IMPLANT
BENZOIN TINCTURE PRP APPL 2/3 (GAUZE/BANDAGES/DRESSINGS) IMPLANT
BINDER BREAST LRG (GAUZE/BANDAGES/DRESSINGS) IMPLANT
BINDER BREAST XLRG (GAUZE/BANDAGES/DRESSINGS) IMPLANT
BINDER BREAST XXLRG (GAUZE/BANDAGES/DRESSINGS) ×1 IMPLANT
BLADE HEX COATED 2.75 (ELECTRODE) ×1 IMPLANT
BLADE SURG 10 STRL SS (BLADE) ×3 IMPLANT
BLADE SURG 15 STRL LF DISP TIS (BLADE) ×1 IMPLANT
BLADE SURG 15 STRL SS (BLADE) ×1
BNDG GAUZE ELAST 4 BULKY (GAUZE/BANDAGES/DRESSINGS) ×4 IMPLANT
CANISTER SUCT 1200ML W/VALVE (MISCELLANEOUS) ×3 IMPLANT
CHLORAPREP W/TINT 26ML (MISCELLANEOUS) ×3 IMPLANT
CLIP APPLIE 11 MED OPEN (CLIP) IMPLANT
CLIP APPLIE 9.375 MED OPEN (MISCELLANEOUS) ×1 IMPLANT
COVER BACK TABLE 60X90IN (DRAPES) ×2 IMPLANT
COVER MAYO STAND STRL (DRAPES) ×3 IMPLANT
COVER PROBE W GEL 5X96 (DRAPES) ×2 IMPLANT
DECANTER SPIKE VIAL GLASS SM (MISCELLANEOUS) IMPLANT
DERMABOND ADVANCED (GAUZE/BANDAGES/DRESSINGS) ×2
DERMABOND ADVANCED .7 DNX12 (GAUZE/BANDAGES/DRESSINGS) ×2 IMPLANT
DRAIN CHANNEL 15F RND FF W/TCR (WOUND CARE) ×3 IMPLANT
DRAIN CHANNEL 19F RND (DRAIN) ×3 IMPLANT
DRAPE LAPAROSCOPIC ABDOMINAL (DRAPES) ×1 IMPLANT
DRAPE TOP ARMCOVERS (MISCELLANEOUS) ×2 IMPLANT
DRAPE U-SHAPE 76X120 STRL (DRAPES) ×2 IMPLANT
DRAPE UTILITY XL STRL (DRAPES) ×2 IMPLANT
DRSG PAD ABDOMINAL 8X10 ST (GAUZE/BANDAGES/DRESSINGS) ×5 IMPLANT
DRSG TEGADERM 4X10 (GAUZE/BANDAGES/DRESSINGS) ×5 IMPLANT
DRSG TEGADERM 4X4.75 (GAUZE/BANDAGES/DRESSINGS) IMPLANT
ELECT BLADE 4.0 EZ CLEAN MEGAD (MISCELLANEOUS) ×2
ELECT COATED BLADE 2.86 ST (ELECTRODE) ×2 IMPLANT
ELECT REM PT RETURN 9FT ADLT (ELECTROSURGICAL) ×2
ELECTRODE BLDE 4.0 EZ CLN MEGD (MISCELLANEOUS) ×1 IMPLANT
ELECTRODE REM PT RTRN 9FT ADLT (ELECTROSURGICAL) ×1 IMPLANT
EVACUATOR SILICONE 100CC (DRAIN) ×5 IMPLANT
EXPANDER BREAST TISSUE 500CC (Breast) ×2 IMPLANT
GAUZE SPONGE 4X4 12PLY STRL LF (GAUZE/BANDAGES/DRESSINGS) IMPLANT
GLOVE BIO SURGEON STRL SZ 6 (GLOVE) ×5 IMPLANT
GLOVE BIOGEL PI IND STRL 6.5 (GLOVE) IMPLANT
GLOVE BIOGEL PI IND STRL 7.0 (GLOVE) IMPLANT
GLOVE BIOGEL PI IND STRL 8 (GLOVE) ×1 IMPLANT
GLOVE BIOGEL PI INDICATOR 6.5 (GLOVE) ×1
GLOVE BIOGEL PI INDICATOR 7.0 (GLOVE) ×3
GLOVE BIOGEL PI INDICATOR 8 (GLOVE) ×1
GLOVE ECLIPSE 6.5 STRL STRAW (GLOVE) ×1 IMPLANT
GLOVE ECLIPSE 8.0 STRL XLNG CF (GLOVE) ×3 IMPLANT
GLOVE SURG SS PI 6.5 STRL IVOR (GLOVE) ×2 IMPLANT
GOWN STRL REUS W/ TWL LRG LVL3 (GOWN DISPOSABLE) ×2 IMPLANT
GOWN STRL REUS W/TWL LRG LVL3 (GOWN DISPOSABLE) ×4
IV NS 500ML (IV SOLUTION)
IV NS 500ML BAXH (IV SOLUTION) ×1 IMPLANT
KIT FILL SYSTEM UNIVERSAL (SET/KITS/TRAYS/PACK) ×2 IMPLANT
LIGHT WAVEGUIDE WIDE FLAT (MISCELLANEOUS) IMPLANT
MARKER SKIN DUAL TIP RULER LAB (MISCELLANEOUS) IMPLANT
NDL HYPO 25X1 1.5 SAFETY (NEEDLE) IMPLANT
NDL SAFETY ECLIPSE 18X1.5 (NEEDLE) ×1 IMPLANT
NEEDLE HYPO 18GX1.5 SHARP (NEEDLE)
NEEDLE HYPO 25X1 1.5 SAFETY (NEEDLE) IMPLANT
NS IRRIG 1000ML POUR BTL (IV SOLUTION) ×1 IMPLANT
PACK BASIN DAY SURGERY FS (CUSTOM PROCEDURE TRAY) ×2 IMPLANT
PENCIL BUTTON HOLSTER BLD 10FT (ELECTRODE) ×2 IMPLANT
PIN SAFETY STERILE (MISCELLANEOUS) ×1 IMPLANT
PUNCH BIOPSY DERMAL 4MM (MISCELLANEOUS) ×1 IMPLANT
SHEET MEDIUM DRAPE 40X70 STRL (DRAPES) ×2 IMPLANT
SLEEVE SCD COMPRESS KNEE MED (MISCELLANEOUS) ×2 IMPLANT
SPONGE LAP 18X18 RF (DISPOSABLE) ×7 IMPLANT
SPONGE LAP 4X18 RFD (DISPOSABLE) IMPLANT
STAPLER VISISTAT 35W (STAPLE) ×2 IMPLANT
STRIP CLOSURE SKIN 1/2X4 (GAUZE/BANDAGES/DRESSINGS) IMPLANT
SUT CHROMIC 3 0 SH 27 (SUTURE) IMPLANT
SUT CHROMIC 4 0 PS 2 18 (SUTURE) ×7 IMPLANT
SUT ETHILON 2 0 FS 18 (SUTURE) ×3 IMPLANT
SUT MNCRL AB 3-0 PS2 18 (SUTURE) ×2 IMPLANT
SUT MNCRL AB 4-0 PS2 18 (SUTURE) ×6 IMPLANT
SUT MON AB 4-0 PC3 18 (SUTURE) ×1 IMPLANT
SUT PDS AB 2-0 CT2 27 (SUTURE) IMPLANT
SUT SILK 2 0 PERMA HAND 18 BK (SUTURE) ×2 IMPLANT
SUT SILK 2 0 SH (SUTURE) ×2 IMPLANT
SUT VIC AB 0 CT1 27 (SUTURE)
SUT VIC AB 0 CT1 27XBRD ANBCTR (SUTURE) IMPLANT
SUT VIC AB 0 SH 27 (SUTURE) IMPLANT
SUT VIC AB 3-0 54X BRD REEL (SUTURE) ×1 IMPLANT
SUT VIC AB 3-0 BRD 54 (SUTURE)
SUT VIC AB 3-0 SH 27 (SUTURE) ×4
SUT VIC AB 3-0 SH 27X BRD (SUTURE) ×1 IMPLANT
SUT VICRYL 0 CT-2 (SUTURE) ×4 IMPLANT
SUT VICRYL 3-0 CR8 SH (SUTURE) ×3 IMPLANT
SUT VICRYL 4-0 PS2 18IN ABS (SUTURE) ×3 IMPLANT
SUT VLOC 180 0 24IN GS25 (SUTURE) ×2 IMPLANT
SYR BULB IRRIGATION 50ML (SYRINGE) ×3 IMPLANT
SYR CONTROL 10ML LL (SYRINGE) ×2 IMPLANT
TAPE MEASURE VINYL STERILE (MISCELLANEOUS) ×1 IMPLANT
TISSUE ALLDRM 8X16 MED THICK (Tissue) IMPLANT
TOWEL GREEN STERILE FF (TOWEL DISPOSABLE) ×4 IMPLANT
TOWEL OR NON WOVEN STRL DISP B (DISPOSABLE) ×1 IMPLANT
TUBE CONNECTING 20X1/4 (TUBING) ×3 IMPLANT
UNDERPAD 30X30 (UNDERPADS AND DIAPERS) ×4 IMPLANT
YANKAUER SUCT BULB TIP NO VENT (SUCTIONS) ×3 IMPLANT

## 2018-04-27 NOTE — Progress Notes (Signed)
Nuc med inj performed by nuc med staff. Pt required no additional sedation and tol well. Will call friend to bedside and update/provide emotional support.

## 2018-04-27 NOTE — Op Note (Signed)
Operative Note   DATE OF OPERATION: 5.30.19  LOCATION:  [facility]  SURGICAL DIVISION: Plastic Surgery  PREOPERATIVE DIAGNOSES:  1. Left breast cancer UOQ ER+  POSTOPERATIVE DIAGNOSES:  same  PROCEDURE:  1. Bilateral breast reconstruction with tissue expanders 2. Acellular dermis (Alloderm) for breast reconstruction 600 cm2   SURGEON: Irene Limbo MD MBA  ASSISTANT: none  ANESTHESIA:  General.   EBL: 250 ml for entire procedure  COMPLICATIONS: None immediate.   INDICATIONS FOR PROCEDURE:  The patient, Julie [name], is a 67 y.o. female born on February 13, 1951, is here for bilateral skin reduction pattern mastectomies with immediate prepectoral tissue expander, acellular dermis reconstruction.   FINDINGS: Natrelle 133FV-13-T 500 ml tissue expanders placed bilateral, initial fill volume 400 ml air. RIGHT SN [id number] LEFT SN [id number]  DESCRIPTION OF PROCEDURE:  The patient was marked with the patient in the preoperative area to mark sternal notch, chest midline, anterior axillary lines and inframammary folds. Patient was marked for skin reduction mastectomy with most superior portion nipple areola marked on breast meridian. Vertical limbs marked by breast displacement and set at 9 cm length. The patient was taken to the operating room. SCDs were placed and IV antibiotics were given. Foley catheter placed. The patient's operative site was prepped and draped in a sterile fashion. A time out was performed and all information was confirmed to be correct. In supine position, the lateral limbs for resection marked and area over lower pole preserved as inferiorly based dermal pedicle. Skin de epithelialized in this area.I assisted in mastectomiesand sentinelnode dissectionwith retraction and exposure.Following completion of mastectomies, reconstruction began on right side.  The cavity was irrigated with solution containingAncef, bacitracin, and gentamicin. Hemostasis  was ensured. A 19 Fr drain was placed in subcutaneous position laterally anda 15 Fr drain placed along inframammary fold. Eachsecured to skin with 2-0 nylon. Cavity irrigated with Betadine.The tissue expanderswere prepared on back table prior in insertion. The expander was filled with air to417ml.Perforated acellular dermis was draped over anterior surface expander. The ADM was then secured to itself over posterior surface of expander. Redundant folds acellular dermis excised so that the ADM lied flat without folds over air filled expander.The expander was secured to medial insertion pectoralis with a 0 vicryl.The superior and lateral tabs also secured to pectoralis muscle with 0-vicryl. The ADM was secured to pectoralis muscle and chest wall along inferior border at inframammary fold.Laterally the mastectomy flap over posterior axillary line was advanced anteriorly and the subcutaneous tissue and superficial fascia was secured to pectoralis muscle and acellular dermis with 0-vicryl. The inferiorly based dermal pedicle was redraped superiorly over expander and acellular dermis and secured to pectoralis with interrupted 0-vicryl. Skin closure completedwith 3-0 vicryl in fascial layer and 4-0 vicryl in dermis. Skin closure completed with 4-0 monocryl subcuticular and tissue adhesive.  I then directed my attention to left chest where similar irrigation and drain placement completed. The prepared expander with ADM secured over anterior surface was placed in right chest and tabs secured to chest wall and pectoralis muscle with 0- vicryl suture. The acellular dermis at inframammary fold was secured to chest wall with 0 V-lock suture.Laterally the mastectomy flap over posterior axillary line was advanced anteriorly and the subcutaneous tissue and superficial fascia was secured to pectoralis muscle and acellular dermis with 0-vicryl. The inferiorly based dermal pedicle was redraped superiorly over expander  and acellular dermis and secured to pectoralis with interrupted 0-vicryl. Skin closure completedwith 3-0 vicryl in fascial layer and 4-0 vicryl in  dermis. Skin closure completed with 4-0 monocryl subcuticular and tissue adhesive.Tegaderms applied bilateral, followed by dry dressing and breast binder.  The patient was allowed to wake from anesthesia, extubated and taken to the recovery room in satisfactory condition.   SPECIMENS: none  DRAINS: 15 and 19 Fr JP in right and left breast reconstruction  Irene Limbo, MD [facility] [phone], pin [phone]

## 2018-04-27 NOTE — Op Note (Signed)
Preoperative diagnosis: Stage II left breast cancer.  Multifocal.   Postoperative diagnosis: Same  Procedure: Left simple mastectomy with left axillary sentinel lymph node mapping of deep level 1 level 2 left axillary sentinel nodes and prophylactic right mastectomy.   Anesthesia: General with bilateral pectoral blocks  Surgeon: Erroll Luna, MD  Assistant Dr. Irene Limbo MD  EBL: 70 cc  Specimen: Bilateral breast pathology.  2 left axillary sentinel nodes to pathology.  IV fluids: Per anesthesia record  Indications for procedure: Patient presents with multifocal left breast cancer diagnosed by core biopsy and MRI.  She opted for bilateral mastectomies given her density on both sides and the potential difficulty in following her right breast long-term.  She desired reconstruction and was seen by plastic surgery.  She opted for bilateral simple mastectomy with immediate reconstruction and left axillary sentinel nodes biopsy.The surgical and non surgical options have been discussed with the patient.  Risks of surgery include bleeding,  Infection,  Flap necrosis,  Tissue loss,  Chronic pain, death, Numbness,  And the need for additional procedures.  Reconstruction options also have been discussed with the patient as well.  The patient agrees to proceed.Sentinel lymph node mapping and dissection has been discussed with the patient.  Risk of bleeding,  Infection,  Seroma formation,  Additional procedures,,  Shoulder weakness ,  Shoulder stiffness,  Nerve and blood vessel injury and reaction to the mapping dyes have been discussed.  Alternatives to surgery have been discussed with the patient.  The patient agrees to proceed.  Description of procedure: The patient was met in the holding area and questions were answered.  She underwent bilateral pectoral block by anesthesia and injection of the left breast with technetium sulfur colloid for mapping purposes.  She was brought back to the  operating room.  She was placed supine upon the operating room table.  After induction of general anesthesia both breasts were prepped and draped in sterile fashion.  Timeout was done to verify proper patient and procedure.  The patient was evaluated by plastic surgery in a triangular pattern incision was placed around both nipple areolar complexes.  This was done with the assistance of plastic surgery.  The right side was done first.  Incision was made in a triangular orientation around the nipple areolar complex.  Skin flaps were raised in a superior, medial, inferior, lateral fashion around the breast tissue to include the nipple areolar complex.  The breast was then dissected off the chest wall in a medial to lateral fashion completing the right axilla intact.  All breast tissue was removed in its entirety.  Hemostasis achieved.  Specimen was oriented and sent to pathology.  Left breast was marked in similar fashion with a triangular base incision around the nipple areolar complex.  The neoprobe was used and the hotspot was identified in the left axilla.  A 4 cm transverse incision was made in the left axilla.  Dissection was carried down into the level 1  contents.  2 hot sentinel nodes were identified and removed.  Background counts approaches 0.  Hemostasis achieved and wound closed with 3-0 Vicryl and 4-0 Monocryl.  Next a triangular-based incision was made around the left nipple areolar complex.  In a similar fashion medial, inferior, superior, lateral skin flaps were raised with letter cautery.  Once the skin flaps were raised circumferentially in the breast tissue the breast was dissected off the chest wall in the medial to lateral fashion.  The tissue was removed and  oriented and sent to pathology.  Hemostasis achieved with cautery.  At this portion the case plastic surgery took over for reconstruction.  Please see their note for details of the remainder the procedure.  At this point all counts were  correct.  EBL at this point was 70 cc.  The patient was stable.

## 2018-04-27 NOTE — Anesthesia Procedure Notes (Signed)
Procedure Name: Intubation Performed by: Terrance Mass, CRNA Pre-anesthesia Checklist: Patient identified, Emergency Drugs available, Suction available and Patient being monitored Patient Re-evaluated:Patient Re-evaluated prior to induction Oxygen Delivery Method: Circle system utilized Preoxygenation: Pre-oxygenation with 100% oxygen Induction Type: IV induction Ventilation: Mask ventilation without difficulty Laryngoscope Size: Miller and 2 Grade View: Grade I Tube type: Oral Tube size: 7.0 mm Number of attempts: 2 Airway Equipment and Method: Stylet and Video-laryngoscopy Placement Confirmation: ETT inserted through vocal cords under direct vision,  positive ETCO2 and breath sounds checked- equal and bilateral Secured at: 22 cm Tube secured with: Tape Dental Injury: Teeth and Oropharynx as per pre-operative assessment

## 2018-04-27 NOTE — Interval H&P Note (Signed)
History and Physical Interval Note:  04/27/2018 7:10 AM  Julie [name]  has presented today for surgery, with the diagnosis of LEFT BREAST CANCER  The various methods of treatment have been discussed with the patient and family. After consideration of risks, benefits and other options for treatment, the patient has consented to  Procedure(s): BILATERAL NIPPLE SPARING MASTECTOMIES WITH LEFT SENTINAL LYMPH NODE BIOPSY (Bilateral) Bilateral BREAST RECONSTRUCTION WITH PLACEMENT OF TISSUE EXPANDER AND FLEX HD (ACELLULAR HYDRATED DERMIS) (Bilateral) as a surgical intervention .  The patient's history has been reviewed, patient examined, no change in status, stable for surgery.  I have reviewed the patient's chart and labs.  Questions were answered to the patient's satisfaction.   NOT PRESERVING NIPPLE DUE TO SHAPE/ SIZE AND COSMETIC CONCERNS AFTER BEING EVALUATED BY PLASTIC SURGERY   The surgical and non surgical options have been discussed with the patient.  Risks of surgery include bleeding,  Infection,  Flap necrosis,  Tissue loss,  Chronic pain, death, Numbness,  And the need for additional procedures.  Reconstruction options also have been discussed with the patient as well.  The patient agrees to proceed.   [place]

## 2018-04-27 NOTE — H&P (Signed)
Julie [name]  Location: [facility] Patient #: [id number] DOB: July 28, 1951 Undefined / Language: Julie [name] / Race: White Female  History of Present Illness Patient words: Patient returns for follow-up of her left breast cancer. MRI showed multiply quadrant disease and second biopsy showed additional invasive disease. She would now like to consider bilateral nipple sparing mastectomy after discussion with her family and doing her own research.  The patient is a 67 year old female.   Allergies  No Known Drug Allergies  Allergies Reconciled  Medication History Julie [name];  Xarelto (10MG  Tablet, Oral) Active. BuPROPion HCl ER (XL) (300MG  Tablet ER 24HR, Oral) Active. Rivaroxaban (15 & 20MG  Tab Ther Pack, Oral) Active. Medications Reconciled    Vitals Julie [name];  03/27/2018 2:43 PM Weight: 212.13 lb Height: 65in Body Surface Area: 2.03 m Body Mass Index: 35.3 kg/m  Temp.: 98.75F(Oral)  Pulse: 96 (Regular)  BP: 132/72 (Sitting, Left Arm, Standard)      Physical Exam (Chany Woolworth A. Fredricka Kohrs MD;   Breast Note: Left breast shows no change except biopsy change. Right breast normal. No palpable mass  Cardiovascular Cardiovascular examination reveals -on palpation PMI is normal in location and amplitude, no palpable S3 or S4. Normal cardiac borders., normal heart sounds, regular rate and rhythm with no murmurs, carotid auscultation reveals no bruits and normal pedal pulses bilaterally.    Assessment & Plan   BREAST CANCER, LEFT (C50.912) Impression: Multifocal  Patient is opted for bilateral simple  mastectomy  Since not a good NSM candidate due to shape/ size. possible. She is seen plastic surgery and given her size she is at the border of this being possible. I will discuss with plastic surgery but she would like to undergo bilateral mastectomies with reconstruction. Risks, benefits and other options discussed. Also  discussed and the lymph node mapping in this circumstance. Discussed treatment options for breast cancer to include breast conservation vs mastectomy with reconstruction. Pt has decided on mastectomy. Risk include bleeding, infection, flap necrosis, pain, numbness, recurrence, hematoma, other surgery needs. Pt understands and agrees to proceed. Risk of sentinel lymph node mapping include bleeding, infection, lymphedema, shoulder pain. stiffness, dye allergy. cosmetic deformity , blood clots, death, need for more surgery. Pt agres to proceed. agrees to proceed.  Current Plans Pt Education - ABC (After Breast Cancer) Class Info: discussed with patient and provided information. Pt Education - CCS Mastectomy HCI Pt Education - flb breast cancer surgery: discussed with patient and provided information. You are being scheduled for surgery- Our schedulers will call you.  You should hear from our office's scheduling department within 5 working days about the location, date, and time of surgery. We try to make accommodations for patient's preferences in scheduling surgery, but sometimes the OR schedule or the surgeon's schedule prevents [place] from making those accommodations.  If you have not heard from our office [phone] in 5 working days, call the office and ask for your surgeon's nurse.  If you have other questions about your diagnosis, plan, or surgery, call the office and ask for your surgeon's nurse.

## 2018-04-27 NOTE — Anesthesia Procedure Notes (Signed)
Anesthesia Regional Block: Pectoralis block   Pre-Anesthetic Checklist: ,, timeout performed, Correct Patient, Correct Site, Correct Laterality, Correct Procedure, Correct Position, site marked, Risks and benefits discussed,  Surgical consent,  Pre-op evaluation,  At surgeon's request and post-op pain management  Laterality: Right and Left  Prep: chloraprep       Needles:   Needle Type: Stimiplex     Needle Length: 9cm      Additional Needles:   Procedures:,,,, ultrasound used (permanent image in chart),,,,  Narrative:  Start time: 04/27/2018 7:12 AM End time: 04/27/2018 7:22 AM Injection made incrementally with aspirations every 5 mL.  Performed by: Personally  Anesthesiologist: Nolon Nations, MD  Additional Notes: Patient tolerated well. Good fascial spread noted.

## 2018-04-27 NOTE — Transfer of Care (Signed)
Immediate Anesthesia Transfer of Care Note  Patient: Julie [name]  Procedure(s) Performed: BILATERAL NIPPLE SPARING MASTECTOMIES WITH LEFT SENTINAL LYMPH NODE BIOPSY (Bilateral Breast) Bilateral BREAST RECONSTRUCTION WITH PLACEMENT OF TISSUE EXPANDER AND FLEX HD (ACELLULAR HYDRATED DERMIS) (Bilateral Breast)  Patient Location: PACU  Anesthesia Type:General and Regional  Level of Consciousness: awake and sedated  Airway & Oxygen Therapy: Patient Spontanous Breathing and Patient connected to face mask oxygen  Post-op Assessment: Report given to RN and Post -op Vital signs reviewed and stable  Post vital signs: Reviewed and stable  Last Vitals:  Vitals Value Taken Time  BP 118/74 04/27/2018 12:27 PM  Temp    Pulse 93 04/27/2018 12:29 PM  Resp 7 04/27/2018 12:29 PM  SpO2 99 % 04/27/2018 12:29 PM  Vitals shown include unvalidated device data.  Last Pain:  Vitals:   04/27/18 0652  TempSrc: Oral  PainSc: 0-No pain         Complications: No apparent anesthesia complications

## 2018-04-27 NOTE — Progress Notes (Signed)
Assisted Dr. Lissa Hoard with right, left, ultrasound guided, pectoralis block. Side rails up, monitors on throughout procedure. See vital signs in flow sheet. Tolerated Procedure well.

## 2018-04-27 NOTE — Anesthesia Postprocedure Evaluation (Signed)
Anesthesia Post Note  Patient: Julie [name]  Procedure(s) Performed: BILATERAL NIPPLE SPARING MASTECTOMIES WITH LEFT SENTINAL LYMPH NODE BIOPSY (Bilateral Breast) Bilateral BREAST RECONSTRUCTION WITH PLACEMENT OF TISSUE EXPANDER AND FLEX HD (ACELLULAR HYDRATED DERMIS) (Bilateral Breast)     Patient location during evaluation: PACU Anesthesia Type: General Level of consciousness: sedated and patient cooperative Pain management: pain level controlled Vital Signs Assessment: post-procedure vital signs reviewed and stable Respiratory status: spontaneous breathing Cardiovascular status: stable Anesthetic complications: no    Last Vitals:  Vitals:   04/27/18 1415 04/27/18 1430  BP:  131/77  Pulse:  82  Resp:  18  Temp:  (!) 36.3 C  SpO2: 95% 94%    Last Pain:  Vitals:   04/27/18 1430  TempSrc:   PainSc: [place]

## 2018-04-27 NOTE — Interval H&P Note (Signed)
History and Physical Interval Note:  04/27/2018 6:38 AM  Julie [name]  has presented today for surgery, with the diagnosis of LEFT BREAST CANCER  The various methods of treatment have been discussed with the patient and family. After consideration of risks, benefits and other options for treatment, the patient has consented to bilateral breast reconstruction with tissue expanders, acellular dermis as a surgical intervention .  The patient's history has been reviewed, patient examined, no change in status, stable for surgery.  I have reviewed the patient's chart and labs.  Questions were answered to the patient's satisfaction.     Julie [name]

## 2018-04-28 ENCOUNTER — Encounter (HOSPITAL_BASED_OUTPATIENT_CLINIC_OR_DEPARTMENT_OTHER): Payer: Self-pay | Admitting: Surgery

## 2018-04-28 DIAGNOSIS — C50412 Malignant neoplasm of upper-outer quadrant of left female breast: Secondary | ICD-10-CM | POA: Diagnosis not present

## 2018-04-28 NOTE — Discharge Instructions (Signed)

## 2018-05-01 NOTE — Progress Notes (Signed)
Disability paperwork successfully faxed to [facility] at [phone]. Mailed copy to patient address on fine.

## 2018-05-05 ENCOUNTER — Telehealth: Payer: Self-pay | Admitting: Hematology and Oncology

## 2018-05-05 ENCOUNTER — Telehealth: Payer: Self-pay | Admitting: *Deleted

## 2018-05-05 ENCOUNTER — Inpatient Hospital Stay: Payer: 59 | Attending: Hematology and Oncology | Admitting: Hematology and Oncology

## 2018-05-05 DIAGNOSIS — Z170 Estrogen receptor positive status [ER+]: Secondary | ICD-10-CM

## 2018-05-05 DIAGNOSIS — C50412 Malignant neoplasm of upper-outer quadrant of left female breast: Secondary | ICD-10-CM | POA: Insufficient documentation

## 2018-05-05 DIAGNOSIS — Z9013 Acquired absence of bilateral breasts and nipples: Secondary | ICD-10-CM | POA: Insufficient documentation

## 2018-05-05 NOTE — Telephone Encounter (Signed)
Faxed medical records to [facility] at [phone] on 05/05/18, Release ID: [id number]

## 2018-05-05 NOTE — Telephone Encounter (Signed)
Received order for oncotype testing. Requisition faxed to pathology. Received by Alyse Low.

## 2018-05-05 NOTE — Progress Notes (Signed)
Patient Care Team: Deland Pretty, MD as PCP - General (Internal Medicine)  DIAGNOSIS:  Encounter Diagnosis  Name Primary?  . Malignant neoplasm of upper-outer quadrant of left breast in female, estrogen receptor positive ([place])     SUMMARY OF ONCOLOGIC HISTORY:   Malignant neoplasm of upper-outer quadrant of left breast in female, estrogen receptor positive ([place])   02/01/2018 Initial Diagnosis    Screening detected architectural distortion in the left breast with calcifications UOQ, by ultrasound 2.2 cm at 2 o'clock position axilla negative, ultrasound biopsy revealed invasive lobular cancer grade 1 with LCIS ER 95% PR 100%, Ki-67 1%, HER-2 negative ratio 1.17, T2N0 stage Ib AJCC 8      04/27/2018 Surgery    Bilateral mastectomies: Left mastectomy: Multifocal invasive lobular cancer grade 2, largest 2.1 cm, margins negative, LCIS, 0/1 lymph node ER 95%, PR 100%, HER-2 negative, Ki-67 1% T2N0; right mastectomy: [facility]      05/05/2018 Cancer Staging    Staging form: Breast, AJCC 8th Edition - Pathologic: Stage IA (pT2(2), pN0(sn), cM0, G2, ER+, PR+, HER2-) - Signed by Nicholas Lose, MD on 05/05/2018       CHIEF COMPLIANT: Follow-up after a bilateral mastectomies for left breast cancer  INTERVAL HISTORY: Julie [name] is a 55-year with above-mentioned history of left breast cancer who underwent bilateral mastectomies and is here to discuss the pathology report.  She is really recovering fairly well from the recent surgery.  She had immediate reconstruction with bilateral chest expanders.  REVIEW OF SYSTEMS:   Constitutional: Denies fevers, chills or abnormal weight loss Eyes: Denies blurriness of vision Ears, nose, mouth, throat, and face: Denies mucositis or sore throat Respiratory: Denies cough, dyspnea or wheezes Cardiovascular: Denies palpitation, chest discomfort Gastrointestinal:  Denies nausea, heartburn or change in bowel habits Skin: Denies abnormal skin  rashes Lymphatics: Denies new lymphadenopathy or easy bruising Neurological:Denies numbness, tingling or new weaknesses Behavioral/Psych: Mood is stable, no new changes  Extremities: No lower extremity edema Breast: Return to clinic based upon Oncotype test result bilateral mastectomies with reconstruction All other systems were reviewed with the patient and are negative.  I have reviewed the past medical history, past surgical history, social history and family history with the patient and they are unchanged from previous note.  ALLERGIES:  has No Known Allergies.  MEDICATIONS:  Current Outpatient Medications  Medication Sig Dispense Refill  . buPROPion (WELLBUTRIN XL) 300 MG 24 hr tablet Take by mouth.    . enoxaparin (LOVENOX) 100 MG/ML injection Inject 1 mL (100 mg total) into the skin daily. 5 Syringe 0  . letrozole (FEMARA) 2.5 MG tablet Take 1 tablet (2.5 mg total) by mouth daily. 30 tablet 6  . methocarbamol (ROBAXIN) 500 MG tablet Take 1 tablet (500 mg total) by mouth every 8 (eight) hours as needed for muscle spasms. 30 tablet 0  . oxyCODONE (ROXICODONE) 5 MG immediate release tablet Take 1-2 tablets (5-10 mg total) by mouth every 4 (four) hours as needed. 40 tablet 0  . sulfamethoxazole-trimethoprim (BACTRIM DS,SEPTRA DS) 800-160 MG tablet Take 1 tablet by mouth 2 (two) times daily. 12 tablet 0  . Vilazodone HCl (VIIBRYD) 40 MG TABS Take by mouth.     No current facility-administered medications for this visit.     PHYSICAL EXAMINATION: ECOG PERFORMANCE STATUS: 1 - Symptomatic but completely ambulatory  Vitals:   05/05/18 1145  BP: (!) 163/91  Pulse: 93  Resp: 18  Temp: 98.6 F (37 C)  SpO2: 97%  Filed Weights   05/05/18 1145  Weight: 207 lb 1.6 oz (93.9 kg)    GENERAL:alert, no distress and comfortable SKIN: skin color, texture, turgor are normal, no rashes or significant lesions EYES: normal, Conjunctiva are pink and non-injected, sclera clear OROPHARYNX:no  exudate, no erythema and lips, buccal mucosa, and tongue normal  NECK: supple, thyroid normal size, non-tender, without nodularity LYMPH:  no palpable lymphadenopathy in the cervical, axillary or inguinal LUNGS: clear to auscultation and percussion with normal breathing effort HEART: regular rate & rhythm and no murmurs and no lower extremity edema ABDOMEN:abdomen soft, non-tender and normal bowel sounds MUSCULOSKELETAL:no cyanosis of digits and no clubbing  NEURO: alert & oriented x 3 with fluent speech, no focal motor/sensory deficits EXTREMITIES: No lower extremity edema  LABORATORY DATA:  I have reviewed the data as listed CMP Latest Ref Rng & Units 04/21/2018 02/08/2018 06/09/2017  Glucose 65 - 99 mg/dL 102(H) 123 117(H)  BUN 6 - 20 mg/dL '13 14 16  '$ Creatinine 0.44 - 1.00 mg/dL 1.21(H) 1.16(H) 1.16(H)  Sodium 135 - 145 mmol/L 144 143 139  Potassium 3.5 - 5.1 mmol/L 4.7 4.5 3.6  Chloride 101 - 111 mmol/L 109 109 107  CO2 22 - 32 mmol/L '28 28 26  '$ Calcium 8.9 - 10.3 mg/dL 9.3 9.8 9.1  Total Protein 6.5 - 8.1 g/dL 7.0 7.3 -  Total Bilirubin 0.3 - 1.2 mg/dL 0.7 0.7 -  Alkaline Phos 38 - 126 U/L 70 80 -  AST 15 - 41 U/L 16 18 -  ALT 14 - 54 U/L 20 24 -    Lab Results  Component Value Date   WBC 7.8 04/21/2018   HGB 14.1 04/21/2018   HCT 44.7 04/21/2018   MCV 93.5 04/21/2018   PLT 221 04/21/2018   NEUTROABS 4.8 04/21/2018    ASSESSMENT & PLAN:  Malignant neoplasm of upper-outer quadrant of left breast in female, estrogen receptor positive ([facility]) 04/27/2018:Bilateral mastectomies: Left mastectomy: Multifocal invasive lobular cancer grade 2, largest 2.1 cm smaller focus 0.7 cm, margins negative, LCIS, 0/1 lymph node ER 95%, PR 100%, HER-2 negative, Ki-67 1% T2N0;  Right mastectomy: ALH Staging: T2N0 stage Ia  Pathology counseling: I discussed the final pathology report of the patient provided  a copy of this report. I discussed the margins as well as lymph node surgeries. We also  discussed the final staging along with previously performed ER/PR and HER-2/neu testing.  Recommendation: Oncotype DX to determine if she would benefit from chemotherapy followed by adjuvant antiestrogen therapy Return to clinic based upon Oncotype test results If she is low risk then she will resume antiestrogen therapy with letrozole 2.5 mg daily I will make an appointment in 3 months for survivorship care plan visit with Mendel Ryder  No orders of the defined types were placed in this encounter.  The patient has a good understanding of the overall plan. she agrees with it. she will call with any problems that may develop before the next visit here.   Harriette Ohara, MD 05/05/18

## 2018-05-05 NOTE — Assessment & Plan Note (Signed)
04/27/2018:Bilateral mastectomies: Left mastectomy: Multifocal invasive lobular cancer grade 2, largest 2.1 cm smaller focus 0.7 cm, margins negative, LCIS, 0/1 lymph node ER 95%, PR 100%, HER-2 negative, Ki-67 1% T2N0;  Right mastectomy: ALH Staging: T2N0 stage Ia  Pathology counseling: I discussed the final pathology report of the patient provided  a copy of this report. I discussed the margins as well as lymph node surgeries. We also discussed the final staging along with previously performed ER/PR and HER-2/neu testing.  Recommendation: Oncotype DX to determine if she would benefit from chemotherapy followed by adjuvant antiestrogen therapy

## 2018-05-05 NOTE — Telephone Encounter (Signed)
Gave patient avs and calendar of upcoming September appointments.  °

## 2018-05-10 ENCOUNTER — Telehealth: Payer: Self-pay

## 2018-05-10 NOTE — Telephone Encounter (Signed)
Received call from [facility], requesting for latest progress notes from last visit 05/05/18. Phone number provided : [phone] and [phone]. Case #[id number]. Faxed last progress note from 05/05/18 with confirmed receipt.

## 2018-05-11 ENCOUNTER — Telehealth: Payer: Self-pay | Admitting: Hematology and Oncology

## 2018-05-11 NOTE — Telephone Encounter (Signed)
Faxed medical record to [facility] on 05/11/18, Release ID: [id number]

## 2018-05-15 ENCOUNTER — Telehealth: Payer: Self-pay | Admitting: *Deleted

## 2018-05-15 NOTE — Telephone Encounter (Signed)
Received Oncotype score of 19/6%. Physician team noticed. Called pt to discuss results and no chemo recommended. Confirmed SCP appt in September. Denies further needs at this time.

## 2018-05-18 ENCOUNTER — Encounter: Payer: Self-pay | Admitting: Hematology and Oncology

## 2018-05-19 ENCOUNTER — Encounter (HOSPITAL_COMMUNITY): Payer: Self-pay | Admitting: Hematology and Oncology

## 2018-05-23 ENCOUNTER — Ambulatory Visit: Payer: 59 | Attending: Plastic Surgery | Admitting: Rehabilitation

## 2018-05-23 ENCOUNTER — Other Ambulatory Visit: Payer: Self-pay

## 2018-05-23 ENCOUNTER — Encounter: Payer: Self-pay | Admitting: Rehabilitation

## 2018-05-23 DIAGNOSIS — Z483 Aftercare following surgery for neoplasm: Secondary | ICD-10-CM | POA: Diagnosis present

## 2018-05-23 DIAGNOSIS — C50412 Malignant neoplasm of upper-outer quadrant of left female breast: Secondary | ICD-10-CM

## 2018-05-23 DIAGNOSIS — Z170 Estrogen receptor positive status [ER+]: Secondary | ICD-10-CM | POA: Insufficient documentation

## 2018-05-23 NOTE — Therapy (Signed)
[place], [place], [address] Phone: [phone]   Fax:  [phone]  Physical Therapy Evaluation  Patient Details  Name: Julie [name] MRN: [id number] Date of Birth: 1950-12-31 Referring Provider: Dr. Irene Limbo, MD   Encounter Date: 05/23/2018  PT End of Session - 05/23/18 1848    Visit Number  1    Number of Visits  1    PT Start Time  0850    PT Stop Time  0932    PT Time Calculation (min)  42 min    Activity Tolerance  Patient tolerated treatment well    Behavior During Therapy  [facility] for tasks assessed/performed       Past Medical History:  Diagnosis Date  . Depression   . DVT (deep venous thrombosis) ([place])   . PONV (postoperative nausea and vomiting)   . Torn meniscus     Past Surgical History:  Procedure Laterality Date  . BREAST RECONSTRUCTION WITH PLACEMENT OF TISSUE EXPANDER AND FLEX HD (ACELLULAR HYDRATED DERMIS) Bilateral 04/27/2018   Procedure: Bilateral BREAST RECONSTRUCTION WITH PLACEMENT OF TISSUE EXPANDER AND FLEX HD (ACELLULAR HYDRATED DERMIS);  Surgeon: Irene Limbo, MD;  Location: [place];  Service: Plastics;  Laterality: Bilateral;  . MASTECTOMY W/ SENTINEL NODE BIOPSY Bilateral 04/27/2018   Procedure: BILATERAL NIPPLE SPARING MASTECTOMIES WITH LEFT SENTINAL LYMPH NODE BIOPSY;  Surgeon: Erroll Luna, MD;  Location: [place];  Service: General;  Laterality: Bilateral;  . TONSILLECTOMY    . TUBAL LIGATION      There were no vitals filed for this visit.   Subjective Assessment - 05/23/18 0853    Subjective  Pt arrives today post bilateral mastectomy without significant concerns or limitations.  Reports she has been doing her exercises from breast clinic and has the most difficulty sleeping due to positioning  should be returning to work next week.      Pertinent History  Pt presents after bilateral mastectomy with spacer  placement on 04/27/18 and Lt lymph node biopsy 0/2.  She will not do chemotherapy or radiation due to low oncotype risk.  Currently taking letrozole.      Currently in Pain?  Yes    Pain Score  2     Pain Location  Breast    Pain Orientation  Left;Right    Pain Descriptors / Indicators  Aching    Pain Type  Surgical pain    Aggravating Factors   sleeping, lifting too much    Pain Relieving Factors  rest, medication         [facility] PT Assessment - 05/23/18 0001      Assessment   Medical Diagnosis  Left breast cancer    Referring Provider  Dr. Irene Limbo, MD    Onset Date/Surgical Date  04/27/18    Hand Dominance  Right    Prior Therapy  none      Precautions   Precautions  Other (comment)    Precaution Comments  cancer      Restrictions   Weight Bearing Restrictions  No      Balance Screen   Has the patient fallen in the past 6 months  No    Has the patient had a decrease in activity level because of a fear of falling?   No    Is the patient reluctant to leave their home because of a fear of falling?   No      Home Environment  Living Environment  Private residence    Living Arrangements  Alone      Prior Function   Level of Independence  Independent    Vocation  Full time employment    Psychologist, sport and exercise for [employer]      Cognition   Overall Cognitive Status  Within Functional Limits for tasks assessed      Observation/Other Assessments   Skin Integrity  has some continued drainage from both incisions onto the binder with an open place under the Lt breast near the chest wall.  presence of scabbing and healing tissue all other locations.  "I was told to start peeling off the scabs but then it started bleeding so i stopped"       Posture/Postural Control   Posture/Postural Control  Postural limitations    Postural Limitations  Rounded Shoulders;Forward head      ROM / Strength   AROM / PROM / Strength  Strength      AROM   Overall AROM Comments   horzontal abduction normal R and 30 L with pull     Right Shoulder Flexion  160 Degrees    Right Shoulder ABduction  160 Degrees    Right Shoulder Internal Rotation  70 Degrees    Right Shoulder External Rotation  80 Degrees    Left Shoulder Extension  55 Degrees    Left Shoulder Flexion  155 Degrees    Left Shoulder ABduction  155 Degrees    Left Shoulder Internal Rotation  70 Degrees    Left Shoulder External Rotation  80 Degrees      Strength   Overall Strength Comments  bil shoulder strong and pain free         LYMPHEDEMA/ONCOLOGY QUESTIONNAIRE - 05/23/18 0853      Type   Cancer Type  Left breast cancer      Surgeries   Mastectomy Date  04/27/18 bilateral mastecomy with spacers    Axillary Lymph Node Dissection Date  04/27/18 0/2 nodes    Number Lymph Nodes Removed  2      Treatment   Active Chemotherapy Treatment  No    Past Chemotherapy Treatment  No    Active Radiation Treatment  No    Past Radiation Treatment  No    Current Hormone Treatment  Yes    Drug Name  letrozole    Past Hormone Therapy  Yes      What other symptoms do you have   Other Symptoms  just tightness across the chest from the expanders      Right Upper Extremity Lymphedema   10 cm Proximal to Olecranon Process  33 cm    Olecranon Process  27 cm    10 cm Proximal to Ulnar Styloid Process  24 cm    Just Proximal to Ulnar Styloid Process  18.7 cm    Across Hand at [employer]  20.5 cm    At [place] of 2nd Digit  7.3 cm      Left Upper Extremity Lymphedema   10 cm Proximal to Olecranon Process  35.3 cm    Olecranon Process  28.5 cm    10 cm Proximal to Ulnar Styloid Process  23.3 cm    Just Proximal to Ulnar Styloid Process  18 cm    Across Hand at [employer]  20.3 cm    At [place] of 2nd Digit  7 cm  Katina Dung - 05/23/18 0001    Open a tight or new jar  Mild difficulty    Do heavy household chores (wash walls, wash floors)  Moderate difficulty    Carry a shopping bag or  briefcase  No difficulty    Wash your back  No difficulty    Use a knife to cut food  No difficulty    Recreational activities in which you take some force or impact through your arm, shoulder, or hand (golf, hammering, tennis)  Severe difficulty    During the past week, to what extent has your arm, shoulder or hand problem interfered with your normal social activities with family, friends, neighbors, or groups?  Modererately    During the past week, to what extent has your arm, shoulder or hand problem limited your work or other regular daily activities  Modererately    Arm, shoulder, or hand pain.  Severe    Tingling (pins and needles) in your arm, shoulder, or hand  Moderate    Difficulty Sleeping  Severe difficulty    DASH Score  40.91 %        Objective measurements completed on examination: See above findings.      [facility] Adult PT Treatment/Exercise - 05/23/18 0001      Self-Care   Self-Care  Other Self-Care Comments    Other Self-Care Comments   walking program education, lymphedema precautions, livestrong program.  Gave pt 2 small ABD squares for the drainage from the breast incision.               PT Education - 05/23/18 1848    Education provided  Yes    Education Details  Live strong handout, walking program handout, lymphedema precautions handout    Person(s) Educated  Patient    Methods  Explanation    Comprehension  Verbalized understanding          PT Long Term Goals - 05/23/18 1852      PT LONG TERM GOAL #1   Title  Pt will be independent with self care at this point in recovery      Time  1    Period  Days    Status  Achieved      PT LONG TERM GOAL #2   Title  Pt will be educated on lymphedema and its precautions    Time  1    Period  Days    Status  Achieved      Breast Clinic Goals - 02/08/18 1137      Patient will be able to verbalize understanding of pertinent lymphedema risk reduction practices relevant to her diagnosis specifically  related to skin care.   Time  1    Period  Days    Status  Achieved      Patient will be able to return demonstrate and/or verbalize understanding of the post-op home exercise program related to regaining shoulder range of motion.   Time  1    Period  Days    Status  Achieved      Patient will be able to verbalize understanding of the importance of attending the postoperative After Breast Cancer Class for further lymphedema risk reduction education and therapeutic exercise.   Time  1    Period  Days    Status  Achieved            Plan - 05/23/18 1848    Clinical Impression Statement  Pt presents after bilateral mastectomy with  spacer placement on 04/27/18 and Lt lymph node biopsy 0/2.  She will not do chemotherapy or radiation due to low oncotype risk.  Currently taking letrozole.  She has maintained excellent shoulder AROM and has excellent shoulder strength bilaterally.  She reports no daily limitations and will be returning to work.  She is agreeable to continueing her current stretches to maintain ROM, start a walking program, and look into livestrong as needed.      Clinical Presentation  Evolving    Clinical Presentation due to:  post op status    Clinical Decision Making  Moderate    PT Frequency  One time visit    PT Treatment/Interventions  ADLs/Self Care Home Management;Patient/family education    Consulted and Agree with Plan of Care  Patient       Patient will benefit from skilled therapeutic intervention in order to improve the following deficits and impairments:     Visit Diagnosis: Malignant neoplasm of upper-outer quadrant of left breast in female, estrogen receptor positive [facility])  Aftercare following surgery for neoplasm     Problem List Patient Active Problem List   Diagnosis Date Noted  . Breast cancer, left breast ([place]) 04/27/2018  . Malignant neoplasm of upper-outer quadrant of left breast in female, estrogen receptor positive ([place]) 02/07/2018     Shan Levans, PT 05/23/2018, 6:55 PM  [place], [place], [address] Phone: [phone]   Fax:  [phone]  Name: IVANA NICASTRO MRN: [id number] Date of Birth: 1951-08-13

## 2018-05-23 NOTE — Patient Instructions (Signed)
Live strong handout, walking program handout, lymphedema precautions handout

## 2018-07-26 NOTE — H&P (Signed)
  Subjective:     Patient ID: Julie [name] is a 67 y.o. female.  HPI  12 weeks post op bilateral mastectomies with immediate expander reconstruction. Plan implant exchange next month.  Presented following screening MMG showing distortion in the left breast with calcifications in the UOQ.  USshowed 2.2 cm mass over UOQ and benign axilla. Biopsy with ILC with LCIS ER/PR+, Her2-.  MRI showed a 2.0 x 1.5 x 1.3 cm biopsy-proven carcinoma over UOQ. In addition multiple areas of clumped, linear enhancement involving all 4 quadrants of the left breast spanning an area measuring 13.5 x 9.0 x 4.3 cm noted. On MRI, left axillary LN with cortical thickening noted. MR guided biopsy of the linear enhancement labeled OLQ with invasive carcinoma and ALH. Second-look left axillary [place] and biopsy negative for carcinoma.  Final pathology right breast ALH, left breast multifocal ILC largest focus 2.1 cm, margins clear, 0/2 SLN negative.  Oncotype 19/no chemo recommended. Letrozole started preoperatively.  History DVT last 8.2018, prior to that states 10 years prior. No hypercoaguability work up recalled by patient. On Xarelto. Currently on Lovenox post op.  Prior 42 C, happy with this. Left mastectomy 1392 g Right 1436 g  States last cig was day of consult with me.   Works as Production assistant, radio at [employer].       Objective:   Physical Exam  Cardiovascular: Normal rate, regular rhythm and normal heart sounds.   Pulmonary/Chest: Effort normal and breath sounds normal.    Abd: soft no herniea,RLQ beneath panniculus with 1.3 cm papular lesion consistent with SK.  Chest: soft,  left T junction  open area greatest dimensions 0.8 x 1 cm, base granulated no exposure TE or ADM , on right healed  Assessment:     Left breast ca UOQ ER+  S/p bilateral SRM, left SLN, prepectoral TE/ADM (Alloderm) reconstruction    Plan:     Discussed saline vs silicone, round vs shaped implants.  Recommend HCG or capacity filled implants given prepectoral position and may offer less visible rippling. Reviewed risks rupture, rotation, displacement, MRI or [place] surveillance with silicone implants, contracture. Plan smooth round.  Discussed purpose fat grafting to minimize rippling, thicken mastectomy flaps. Reviewed risks including donor site pain, need for compression, fat necrosis that presents as lumps, need to repeat, variable take graft.   Will also plan excision RLQ SK at time of surgery.  Will hold Xarelto 3 days prior.  Rx for Lovenox for week post op, oxycodone, robaxin, and Bactrim given.Gomez Cleverly [id number] 500 ml tissue expanders placed bilateral, fill volume 500 ml saline.  Irene Limbo, MD [facility] [phone], pin [phone]

## 2018-07-27 ENCOUNTER — Telehealth: Payer: Self-pay

## 2018-07-27 NOTE — Telephone Encounter (Signed)
Spoke with patient to remind of SCP visit with [place] on 08/04/18 at 2 pm.  Pt said she will come to appt.

## 2018-08-04 ENCOUNTER — Telehealth: Payer: Self-pay | Admitting: Adult Health

## 2018-08-04 ENCOUNTER — Inpatient Hospital Stay: Payer: 59 | Admitting: Adult Health

## 2018-08-04 ENCOUNTER — Other Ambulatory Visit: Payer: Self-pay

## 2018-08-04 ENCOUNTER — Encounter (HOSPITAL_BASED_OUTPATIENT_CLINIC_OR_DEPARTMENT_OTHER): Payer: Self-pay

## 2018-08-04 NOTE — Telephone Encounter (Signed)
Spoke to pt regarding r/s appts due to [facility] being out of office.

## 2018-08-04 NOTE — Progress Notes (Signed)
Pt on Xarelto for DVT. Per Dr Iran Planas LD will be 08/07/18 and will bridge with lovenox.

## 2018-08-09 NOTE — Progress Notes (Signed)
Patient given Ensure pre surgery drink.  Patient advised to drink at 0415am DOS.  Hibiclens given to patient with instructions on usage before surgery.  Patient verbalized understanding.

## 2018-08-10 NOTE — Anesthesia Preprocedure Evaluation (Addendum)
Anesthesia Evaluation  Patient identified by MRN, date of birth, ID band Patient awake    Reviewed: Allergy & Precautions, NPO status , Patient's Chart, lab work & pertinent test results  Airway Mallampati: II  TM Distance: >3 FB Neck ROM: Full    Dental no notable dental hx. (+) Teeth Intact, Dental Advisory Given   Pulmonary neg pulmonary ROS, former smoker,    Pulmonary exam normal breath sounds clear to auscultation       Cardiovascular Exercise Tolerance: Good Normal cardiovascular exam Rhythm:Regular Rate:Normal     Neuro/Psych    GI/Hepatic negative GI ROS, Neg liver ROS,   Endo/Other    Renal/GU      Musculoskeletal   Abdominal (+) + obese,   Peds  Hematology   Anesthesia Other Findings   Reproductive/Obstetrics                            Anesthesia Physical Anesthesia Plan  ASA: II  Anesthesia Plan: General   Post-op Pain Management:    Induction: Intravenous  PONV Risk Score and Plan: 4 or greater and Treatment may vary due to age or medical condition, Ondansetron, Dexamethasone and Scopolamine patch - Pre-op  Airway Management Planned: Oral ETT  Additional Equipment:   Intra-op Plan:   Post-operative Plan: Extubation in OR  Informed Consent: I have reviewed the patients History and Physical, chart, labs and discussed the procedure including the risks, benefits and alternatives for the proposed anesthesia with the patient or authorized representative who has indicated his/her understanding and acceptance.   Dental advisory given  Plan Discussed with: CRNA  Anesthesia Plan Comments:       Anesthesia Quick Evaluation

## 2018-08-11 ENCOUNTER — Other Ambulatory Visit: Payer: Self-pay

## 2018-08-11 ENCOUNTER — Ambulatory Visit (HOSPITAL_BASED_OUTPATIENT_CLINIC_OR_DEPARTMENT_OTHER): Payer: 59 | Admitting: Anesthesiology

## 2018-08-11 ENCOUNTER — Ambulatory Visit (HOSPITAL_BASED_OUTPATIENT_CLINIC_OR_DEPARTMENT_OTHER)
Admission: RE | Admit: 2018-08-11 | Discharge: 2018-08-11 | Disposition: A | Discharge status: Home / Self Care | Payer: 59 | Source: Ambulatory Visit | Attending: Plastic Surgery | Admitting: Plastic Surgery

## 2018-08-11 ENCOUNTER — Encounter (HOSPITAL_BASED_OUTPATIENT_CLINIC_OR_DEPARTMENT_OTHER)
Admission: RE | Disposition: A | Discharge status: Home / Self Care | Payer: Self-pay | Source: Ambulatory Visit | Attending: Plastic Surgery

## 2018-08-11 ENCOUNTER — Encounter (HOSPITAL_BASED_OUTPATIENT_CLINIC_OR_DEPARTMENT_OTHER): Payer: Self-pay | Admitting: *Deleted

## 2018-08-11 DIAGNOSIS — Z421 Encounter for breast reconstruction following mastectomy: Secondary | ICD-10-CM | POA: Diagnosis present

## 2018-08-11 DIAGNOSIS — Z86718 Personal history of other venous thrombosis and embolism: Secondary | ICD-10-CM | POA: Diagnosis not present

## 2018-08-11 DIAGNOSIS — Z9013 Acquired absence of bilateral breasts and nipples: Secondary | ICD-10-CM | POA: Insufficient documentation

## 2018-08-11 DIAGNOSIS — Z87891 Personal history of nicotine dependence: Secondary | ICD-10-CM | POA: Diagnosis not present

## 2018-08-11 DIAGNOSIS — L821 Other seborrheic keratosis: Secondary | ICD-10-CM | POA: Insufficient documentation

## 2018-08-11 DIAGNOSIS — Z853 Personal history of malignant neoplasm of breast: Secondary | ICD-10-CM | POA: Insufficient documentation

## 2018-08-11 DIAGNOSIS — Z7901 Long term (current) use of anticoagulants: Secondary | ICD-10-CM | POA: Diagnosis not present

## 2018-08-11 HISTORY — PX: LESION REMOVAL: SHX5196

## 2018-08-11 HISTORY — PX: LIPOSUCTION WITH LIPOFILLING: SHX6436

## 2018-08-11 HISTORY — PX: REMOVAL OF TISSUE EXPANDER AND PLACEMENT OF IMPLANT: SHX6457

## 2018-08-11 SURGERY — REMOVAL, TISSUE EXPANDER, BREAST, WITH IMPLANT INSERTION
Anesthesia: General | Site: Chest | Laterality: Right

## 2018-08-11 MED ORDER — EPHEDRINE SULFATE 50 MG/ML IJ SOLN
INTRAMUSCULAR | Status: DC | PRN
  Administered 2018-08-11 (×2): 25 mg via INTRAVENOUS

## 2018-08-11 MED ORDER — CHLORHEXIDINE GLUCONATE CLOTH 2 % EX PADS: 6.0000 | MEDICATED_PAD | Freq: Once | CUTANEOUS | Status: DC

## 2018-08-11 MED ORDER — SODIUM CHLORIDE 0.9 % IV SOLN
INTRAVENOUS | Status: DC | PRN
  Administered 2018-08-11: 1000 mL

## 2018-08-11 MED ORDER — LACTATED RINGERS IV SOLN
INTRAVENOUS | Status: DC
  Administered 2018-08-11 (×3): via INTRAVENOUS

## 2018-08-11 MED ORDER — MIDAZOLAM HCL 5 MG/5ML IJ SOLN
INTRAMUSCULAR | Status: DC | PRN
  Administered 2018-08-11: 2 mg via INTRAVENOUS

## 2018-08-11 MED ORDER — PHENYLEPHRINE HCL 10 MG/ML IJ SOLN
INTRAMUSCULAR | Status: DC | PRN
  Administered 2018-08-11 (×2): 80 ug via INTRAVENOUS

## 2018-08-11 MED ORDER — FENTANYL CITRATE (PF) 100 MCG/2ML IJ SOLN
INTRAMUSCULAR | Status: AC
  Filled 2018-08-11: qty 2

## 2018-08-11 MED ORDER — FENTANYL CITRATE (PF) 100 MCG/2ML IJ SOLN: 25.0000 ug | INTRAMUSCULAR | Status: DC | PRN

## 2018-08-11 MED ORDER — SUCCINYLCHOLINE CHLORIDE 20 MG/ML IJ SOLN
INTRAMUSCULAR | Status: DC | PRN
  Administered 2018-08-11: 50 mg via INTRAVENOUS

## 2018-08-11 MED ORDER — CEFAZOLIN SODIUM-DEXTROSE 2-4 GM/100ML-% IV SOLN
INTRAVENOUS | Status: AC
  Filled 2018-08-11: qty 100

## 2018-08-11 MED ORDER — FENTANYL CITRATE (PF) 100 MCG/2ML IJ SOLN
INTRAMUSCULAR | Status: DC | PRN
  Administered 2018-08-11: 100 ug via INTRAVENOUS

## 2018-08-11 MED ORDER — ONDANSETRON HCL 4 MG/2ML IJ SOLN
INTRAMUSCULAR | Status: DC | PRN
  Administered 2018-08-11: 4 mg via INTRAVENOUS

## 2018-08-11 MED ORDER — MIDAZOLAM HCL 2 MG/2ML IJ SOLN: 1.0000 mg | INTRAMUSCULAR | Status: DC | PRN

## 2018-08-11 MED ORDER — DEXAMETHASONE SODIUM PHOSPHATE 4 MG/ML IJ SOLN
INTRAMUSCULAR | Status: DC | PRN
  Administered 2018-08-11: 10 mg via INTRAVENOUS

## 2018-08-11 MED ORDER — DEXAMETHASONE SODIUM PHOSPHATE 10 MG/ML IJ SOLN
INTRAMUSCULAR | Status: AC
  Filled 2018-08-11: qty 1

## 2018-08-11 MED ORDER — EPHEDRINE 5 MG/ML INJ
INTRAVENOUS | Status: AC
  Filled 2018-08-11: qty 10

## 2018-08-11 MED ORDER — CELECOXIB 200 MG PO CAPS
ORAL_CAPSULE | ORAL | Status: AC
  Filled 2018-08-11: qty 1

## 2018-08-11 MED ORDER — LIDOCAINE HCL (CARDIAC) PF 100 MG/5ML IV SOSY
PREFILLED_SYRINGE | INTRAVENOUS | Status: DC | PRN
  Administered 2018-08-11: 100 mg via INTRAVENOUS

## 2018-08-11 MED ORDER — CELECOXIB 200 MG PO CAPS
200.0000 mg | ORAL_CAPSULE | ORAL | Status: AC
  Administered 2018-08-11: 200 mg via ORAL

## 2018-08-11 MED ORDER — SODIUM BICARBONATE 4 % IV SOLN
INTRAVENOUS | Status: AC
  Filled 2018-08-11: qty 20

## 2018-08-11 MED ORDER — MIDAZOLAM HCL 2 MG/2ML IJ SOLN
INTRAMUSCULAR | Status: AC
  Filled 2018-08-11: qty 2

## 2018-08-11 MED ORDER — SCOPOLAMINE 1 MG/3DAYS TD PT72: 1.0000 | MEDICATED_PATCH | Freq: Once | TRANSDERMAL | Status: DC | PRN

## 2018-08-11 MED ORDER — CEFAZOLIN SODIUM-DEXTROSE 2-4 GM/100ML-% IV SOLN
2.0000 g | INTRAVENOUS | Status: AC
  Administered 2018-08-11: 2 g via INTRAVENOUS

## 2018-08-11 MED ORDER — MEPERIDINE HCL 25 MG/ML IJ SOLN: 6.2500 mg | INTRAMUSCULAR | Status: DC | PRN

## 2018-08-11 MED ORDER — SUCCINYLCHOLINE CHLORIDE 200 MG/10ML IV SOSY
PREFILLED_SYRINGE | INTRAVENOUS | Status: AC
  Filled 2018-08-11: qty 10

## 2018-08-11 MED ORDER — EPINEPHRINE 30 MG/30ML IJ SOLN
INTRAMUSCULAR | Status: AC
  Filled 2018-08-11: qty 1

## 2018-08-11 MED ORDER — SODIUM BICARBONATE 4 % IV SOLN
INTRAVENOUS | Status: DC | PRN
  Administered 2018-08-11: 600 mL via INTRAMUSCULAR

## 2018-08-11 MED ORDER — PROPOFOL 500 MG/50ML IV EMUL
INTRAVENOUS | Status: AC
  Filled 2018-08-11: qty 50

## 2018-08-11 MED ORDER — FENTANYL CITRATE (PF) 100 MCG/2ML IJ SOLN: 50.0000 ug | INTRAMUSCULAR | Status: DC | PRN

## 2018-08-11 MED ORDER — HYDROCODONE-ACETAMINOPHEN 7.5-325 MG PO TABS: 1.0000 | ORAL_TABLET | Freq: Once | ORAL | Status: DC | PRN

## 2018-08-11 MED ORDER — LIDOCAINE HCL (PF) 1 % IJ SOLN
INTRAMUSCULAR | Status: AC
  Filled 2018-08-11: qty 120

## 2018-08-11 MED ORDER — PROPOFOL 10 MG/ML IV BOLUS
INTRAVENOUS | Status: DC | PRN
  Administered 2018-08-11 (×2): 50 mg via INTRAVENOUS
  Administered 2018-08-11: 170 mg via INTRAVENOUS
  Administered 2018-08-11: 50 mg via INTRAVENOUS

## 2018-08-11 MED ORDER — ONDANSETRON HCL 4 MG/2ML IJ SOLN
INTRAMUSCULAR | Status: AC
  Filled 2018-08-11: qty 2

## 2018-08-11 MED ORDER — GABAPENTIN 300 MG PO CAPS
ORAL_CAPSULE | ORAL | Status: AC
  Filled 2018-08-11: qty 1

## 2018-08-11 MED ORDER — GABAPENTIN 300 MG PO CAPS
300.0000 mg | ORAL_CAPSULE | ORAL | Status: AC
  Administered 2018-08-11: 300 mg via ORAL

## 2018-08-11 MED ORDER — BUPIVACAINE-EPINEPHRINE (PF) 0.25% -1:200000 IJ SOLN
INTRAMUSCULAR | Status: AC
  Filled 2018-08-11: qty 60

## 2018-08-11 MED ORDER — OXYCODONE HCL 5 MG/5ML PO SOLN: 5.0000 mg | Freq: Once | ORAL | Status: DC | PRN

## 2018-08-11 MED ORDER — ACETAMINOPHEN 500 MG PO TABS
ORAL_TABLET | ORAL | Status: AC
  Filled 2018-08-11: qty 2

## 2018-08-11 MED ORDER — OXYCODONE HCL 5 MG PO TABS: 5.0000 mg | ORAL_TABLET | Freq: Once | ORAL | Status: DC | PRN

## 2018-08-11 MED ORDER — LIDOCAINE 2% (20 MG/ML) 5 ML SYRINGE
INTRAMUSCULAR | Status: AC
  Filled 2018-08-11: qty 5

## 2018-08-11 MED ORDER — ACETAMINOPHEN 10 MG/ML IV SOLN: 1000.0000 mg | Freq: Once | INTRAVENOUS | Status: DC | PRN

## 2018-08-11 MED ORDER — ACETAMINOPHEN 500 MG PO TABS
1000.0000 mg | ORAL_TABLET | ORAL | Status: AC
  Administered 2018-08-11: 1000 mg via ORAL

## 2018-08-11 SURGICAL SUPPLY — 81 items
BAG DECANTER FOR FLEXI CONT (MISCELLANEOUS) ×5 IMPLANT
BINDER ABDOMINAL 10 UNV 27-48 (MISCELLANEOUS) IMPLANT
BINDER ABDOMINAL 12 SM 30-45 (SOFTGOODS) ×5 IMPLANT
BINDER BREAST LRG (GAUZE/BANDAGES/DRESSINGS) IMPLANT
BINDER BREAST MEDIUM (GAUZE/BANDAGES/DRESSINGS) IMPLANT
BINDER BREAST XLRG (GAUZE/BANDAGES/DRESSINGS) ×5 IMPLANT
BINDER BREAST XXLRG (GAUZE/BANDAGES/DRESSINGS) IMPLANT
BLADE SURG 10 STRL SS (BLADE) ×10 IMPLANT
BLADE SURG 11 STRL SS (BLADE) ×5 IMPLANT
BLADE SURG 15 STRL LF DISP TIS (BLADE) IMPLANT
BLADE SURG 15 STRL SS (BLADE)
BNDG GAUZE ELAST 4 BULKY (GAUZE/BANDAGES/DRESSINGS) ×10 IMPLANT
CANISTER LIPO FAT HARVEST (MISCELLANEOUS) ×5 IMPLANT
CANISTER SUCT 1200ML W/VALVE (MISCELLANEOUS) ×10 IMPLANT
CHLORAPREP W/TINT 26ML (MISCELLANEOUS) ×10 IMPLANT
COVER BACK TABLE 60X90IN (DRAPES) ×5 IMPLANT
COVER MAYO STAND STRL (DRAPES) ×5 IMPLANT
DECANTER SPIKE VIAL GLASS SM (MISCELLANEOUS) IMPLANT
DERMABOND ADVANCED (GAUZE/BANDAGES/DRESSINGS) ×4
DERMABOND ADVANCED .7 DNX12 (GAUZE/BANDAGES/DRESSINGS) ×6 IMPLANT
DRAIN CHANNEL 15F RND FF W/TCR (WOUND CARE) IMPLANT
DRAPE TOP ARMCOVERS (MISCELLANEOUS) ×5 IMPLANT
DRAPE U-SHAPE 76X120 STRL (DRAPES) ×5 IMPLANT
DRSG PAD ABDOMINAL 8X10 ST (GAUZE/BANDAGES/DRESSINGS) ×20 IMPLANT
ELECT BLADE 4.0 EZ CLEAN MEGAD (MISCELLANEOUS) ×5
ELECT COATED BLADE 2.86 ST (ELECTRODE) ×5 IMPLANT
ELECT REM PT RETURN 9FT ADLT (ELECTROSURGICAL) ×5
ELECTRODE BLDE 4.0 EZ CLN MEGD (MISCELLANEOUS) ×3 IMPLANT
ELECTRODE REM PT RTRN 9FT ADLT (ELECTROSURGICAL) ×3 IMPLANT
EVACUATOR SILICONE 100CC (DRAIN) IMPLANT
GLOVE BIO SURGEON STRL SZ 6 (GLOVE) ×15 IMPLANT
GLOVE BIO SURGEON STRL SZ7 (GLOVE) ×5 IMPLANT
GLOVE EXAM NITRILE MD LF STRL (GLOVE) ×5 IMPLANT
GOWN STRL REUS W/ TWL LRG LVL3 (GOWN DISPOSABLE) ×3 IMPLANT
GOWN STRL REUS W/ TWL XL LVL3 (GOWN DISPOSABLE) ×3 IMPLANT
GOWN STRL REUS W/TWL LRG LVL3 (GOWN DISPOSABLE) ×2
GOWN STRL REUS W/TWL XL LVL3 (GOWN DISPOSABLE) ×2
IMPL BREAST INSPI SRX 700CC (Breast) ×6 IMPLANT
IMPLANT BREAST INSPI SRX 700CC (Breast) ×10 IMPLANT
IV NS 500ML (IV SOLUTION)
IV NS 500ML BAXH (IV SOLUTION) IMPLANT
KIT FILL SYSTEM UNIVERSAL (SET/KITS/TRAYS/PACK) IMPLANT
LINER CANISTER 1000CC FLEX (MISCELLANEOUS) ×5 IMPLANT
MARKER SKIN DUAL TIP RULER LAB (MISCELLANEOUS) IMPLANT
NDL SAFETY ECLIPSE 18X1.5 (NEEDLE) ×9 IMPLANT
NEEDLE HYPO 18GX1.5 SHARP (NEEDLE) ×6
NEEDLE HYPO 25X1 1.5 SAFETY (NEEDLE) ×5 IMPLANT
NS IRRIG 1000ML POUR BTL (IV SOLUTION) ×5 IMPLANT
PACK BASIN DAY SURGERY FS (CUSTOM PROCEDURE TRAY) ×5 IMPLANT
PAD ALCOHOL SWAB (MISCELLANEOUS) ×5 IMPLANT
PENCIL BUTTON HOLSTER BLD 10FT (ELECTRODE) ×5 IMPLANT
PIN SAFETY STERILE (MISCELLANEOUS) IMPLANT
SHEET MEDIUM DRAPE 40X70 STRL (DRAPES) ×10 IMPLANT
SIZER BREAST REUSE XFP 650CC (SIZER) ×5
SIZER BREAST REUSE XFP 700CC (SIZER) ×5
SIZER BRST REUSE XFP 650CC (SIZER) ×3 IMPLANT
SIZER BRST REUSE XFP 700CC (SIZER) ×3 IMPLANT
SLEEVE SCD COMPRESS KNEE MED (MISCELLANEOUS) ×5 IMPLANT
SPONGE LAP 18X18 RF (DISPOSABLE) ×15 IMPLANT
STAPLER VISISTAT 35W (STAPLE) ×5 IMPLANT
SUT ETHILON 2 0 FS 18 (SUTURE) IMPLANT
SUT MNCRL AB 4-0 PS2 18 (SUTURE) ×15 IMPLANT
SUT PDS AB 2-0 CT2 27 (SUTURE) ×15 IMPLANT
SUT SILK 2 0 SH (SUTURE) IMPLANT
SUT VIC AB 3-0 PS1 18 (SUTURE)
SUT VIC AB 3-0 PS1 18XBRD (SUTURE) IMPLANT
SUT VIC AB 3-0 SH 27 (SUTURE) ×4
SUT VIC AB 3-0 SH 27X BRD (SUTURE) ×6 IMPLANT
SUT VICRYL 4-0 PS2 18IN ABS (SUTURE) ×10 IMPLANT
SYR 10ML LL (SYRINGE) ×15 IMPLANT
SYR 50ML LL SCALE MARK (SYRINGE) ×20 IMPLANT
SYR BULB IRRIGATION 50ML (SYRINGE) ×10 IMPLANT
SYR CONTROL 10ML LL (SYRINGE) ×5 IMPLANT
SYR TB 1ML LL NO SAFETY (SYRINGE) ×5 IMPLANT
TOWEL GREEN STERILE FF (TOWEL DISPOSABLE) ×10 IMPLANT
TUBE CONNECTING 20'X1/4 (TUBING) ×2
TUBE CONNECTING 20X1/4 (TUBING) ×8 IMPLANT
TUBING INFILTRATION IT-10001 (TUBING) ×5 IMPLANT
TUBING SET GRADUATE ASPIR 12FT (MISCELLANEOUS) ×5 IMPLANT
UNDERPAD 30X30 (UNDERPADS AND DIAPERS) ×10 IMPLANT
YANKAUER SUCT BULB TIP NO VENT (SUCTIONS) ×5 IMPLANT

## 2018-08-11 NOTE — Transfer of Care (Signed)
22Immediate Anesthesia Transfer of Care Note  Patient: Julie [name]  Procedure(s) Performed: REMOVAL OF BILATERAL TISSUE EXPANDERS AND PLACEMENT OF SILICONE  IMPLANTS (Bilateral Breast) LIPOFILLING (Bilateral Chest)  Patient Location: PACU  Anesthesia Type:General  Level of Consciousness: awake and patient cooperative  Airway & Oxygen Therapy: Patient Spontanous Breathing and Patient connected to face mask oxygen  Post-op Assessment: Report given to RN and Post -op Vital signs reviewed and stable  Post vital signs: Reviewed and stable  Last Vitals:  Vitals Value Taken Time  BP    Temp    Pulse    Resp    SpO2      Last Pain:  Vitals:   08/11/18 7412  TempSrc: Oral  PainSc: 0-No pain         Complications: No apparent anesthesia complications

## 2018-08-11 NOTE — Op Note (Signed)
Operative Note   DATE OF OPERATION: 9.13.19  LOCATION: [facility]-outpatient  SURGICAL DIVISION: Plastic Surgery  PREOPERATIVE DIAGNOSES:  1. History breast cancer 2. Acquired absence breasts 3. Seborrheic keratosis abdomen  POSTOPERATIVE DIAGNOSES:  same  PROCEDURE:  1. Removal bilateral tissue expanders and placement silicone implants 2. Liposuction bilateral lateral chest wall 3. Lipofilling from abdomen to bilateral chest 4.excision benign lesion abdomen 2 cm 5. Layered closure abdomen 3 cm  SURGEON: Irene Limbo MD MBA  ASSISTANT: none  ANESTHESIA:  General.   EBL: 75 ml  COMPLICATIONS: None immediate.   INDICATIONS FOR PROCEDURE:  The patient, Julie [name], is a 67 y.o. female born on 08/08/1951, is here for second stage breast reconstruction following bilateral skin reduction pattern mastectomies with immediate prepectoral placement tissue expander. She also desires excision benign skin lesion abdomen located in skin fold and frequently traumatized.   FINDINGS: 20 ml fat infiltrated over right chest, 40 ml fat over left chest. Natrelle Inspira Smooth Round Extra Projection 700 ml implants placed bilateral. REF SRX-700 RIGHT SN [id number] LEFT SN [id number]  DESCRIPTION OF PROCEDURE:  The patient's operative site was marked with the patient in the preoperative area to mark sternal notch, chest midline, anterior axillary lines.Supra and infraumbilcal abdomen marked for liposuction. Lateral chest wall marked for liposuction bilateral.The patientwas taken to the operating room. SCDs were placed and IV antibiotics were given. The patient's operative site was prepped and draped in a sterile fashion. A time out was performed and all information was confirmed to be correct.I began onleftside. Incision made through prior inframammary fold scar and carried through superficial fascia tp acellular demis. ADM incised. Expander removed. Thermal capsulorraphy performed over  lateral chest wall. All ADM noted to be incorporated. Sizer placed.  I then directed attention torightbreast and implant cavity entered in similar manner. Expander removed. ADM noted to have pulled away from chest wall. hermal capsulorraphy performed over lateral chest wall. Interrupted 2-0 PDS used to suture lateral border ADM to chest wall. Sizer placed and patient brought to upright sitting position. Natrelle Smooth Round Extra Projection 700 ml implants selected for bilateral placement.The patient was returned to supine position.  Stab incision made over bilateralabdomenand tumescent fluid infiltrated over supra and infraumbilical abdomen, lateral chest wall bilateral,total418ml tumescent infiltrated. Power assisted liposuction performed to endpoint symmetric contour and soft tissue thickness, total lipoaspirate350 ml. The fat was then washed and prepared by gravity for infiltration. Harvested fat was then infiltrated in subcutaneous plane throughout total envelope mastectomy flaps.  Sharp excision of right lower quadrant skin excision completed, diameter 2 cm. Layered closure completed with 4-0 vicryl in dermis, running 4-0 monocryl skin closure, length 3 cm.  Each cavity irrigated with bacitracin, Ancef, gentamicinsolution.Hemostasis ensured.Each cavity then irrigated with Betadine. The implant was placed inrighttchest andimplant orientation ensured. Closure completed with 3-0 vicryl to close superficial fascia and ADM over implant. 4-0 vicryl used to close dermis followed by 4-0 monocryl subcuticular. Implant placed in left  chest cavity.Closure completedin similar fashion.Abdomen incisions approximated with simple 4-0 monocryl stitch.Tissue adhesive and dry dressing applied, followed by breastbinder and abdominal compression garment.  The patient was allowed to wake from anesthesia, extubated and taken to the recovery room in satisfactory condition.   SPECIMENS: abdomen  skin lesion  DRAINS: none  Irene Limbo, MD [facility] [phone], pin [phone]

## 2018-08-11 NOTE — Discharge Instructions (Signed)

## 2018-08-11 NOTE — Anesthesia Postprocedure Evaluation (Signed)
Anesthesia Post Note  Patient: Julie [name]  Procedure(s) Performed: REMOVAL OF BILATERAL TISSUE EXPANDERS AND PLACEMENT OF SILICONE  IMPLANTS (Bilateral Breast) LIPOSUCTION WITH LIPOFILLING FROM ABDOMEN TO CHEST (Bilateral Chest) EXCISION OF SKIN LESION FROM LOWER ABDOMEN (Right Abdomen)     Patient location during evaluation: PACU Anesthesia Type: General Level of consciousness: awake and alert Pain management: pain level controlled Vital Signs Assessment: post-procedure vital signs reviewed and stable Respiratory status: spontaneous breathing, nonlabored ventilation, respiratory function stable and patient connected to nasal cannula oxygen Cardiovascular status: blood pressure returned to baseline and stable Postop Assessment: no apparent nausea or vomiting Anesthetic complications: no    Last Vitals:  Vitals:   08/11/18 1043 08/11/18 1110  BP:  (!) 170/88  Pulse: 95 99  Resp: 20 16  Temp:  (!) 36.4 C  SpO2: 95% 96%    Last Pain:  Vitals:   08/11/18 1110  TempSrc:   PainSc: 2                  Barnet Glasgow

## 2018-08-11 NOTE — Interval H&P Note (Signed)
History and Physical Interval Note:  08/11/2018 6:52 AM  Julie [name]  has presented today for surgery, with the diagnosis of HISTORY OF BREAST CANCER,ACQUIRED ABACERNCE OF BREAST  The various methods of treatment have been discussed with the patient and family. After consideration of risks, benefits and other options for treatment, the patient has consented to  Procedure(s): REMOVAL OF BILATERAL TISSUE EXPANDER AND PLACEMENT OF SILICONE  IMPLANT (Bilateral) POSSIBLE LIPOFILLING (Bilateral) , excision abdominal skin lesion, possible liposuction chest as a surgical intervention .  The patient's history has been reviewed, patient examined, no change in status, stable for surgery.  I have reviewed the patient's chart and labs.  Questions were answered to the patient's satisfaction.     Cherylin Waguespack

## 2018-08-11 NOTE — Anesthesia Procedure Notes (Signed)
Procedure Name: Intubation Date/Time: 08/11/2018 7:43 AM Performed by: Marrianne Mood, CRNA Pre-anesthesia Checklist: Patient identified, Emergency Drugs available, Suction available, Patient being monitored and Timeout performed Patient Re-evaluated:Patient Re-evaluated prior to induction Oxygen Delivery Method: Circle system utilized Preoxygenation: Pre-oxygenation with 100% oxygen Induction Type: IV induction Ventilation: Mask ventilation without difficulty Laryngoscope Size: Mac and 3 Grade View: Grade III Tube type: Oral Tube size: 7.0 mm Number of attempts: 1 Airway Equipment and Method: Stylet and Oral airway Placement Confirmation: ETT inserted through vocal cords under direct vision,  positive ETCO2 and breath sounds checked- equal and bilateral Secured at: 22 cm Tube secured with: Tape Dental Injury: Teeth and Oropharynx as per pre-operative assessment

## 2018-08-14 ENCOUNTER — Encounter (HOSPITAL_BASED_OUTPATIENT_CLINIC_OR_DEPARTMENT_OTHER): Payer: Self-pay | Admitting: Plastic Surgery

## 2018-08-21 ENCOUNTER — Encounter: Payer: Self-pay | Admitting: Adult Health

## 2018-08-21 ENCOUNTER — Telehealth: Payer: Self-pay | Admitting: Adult Health

## 2018-08-21 ENCOUNTER — Inpatient Hospital Stay: Payer: 59 | Attending: Hematology and Oncology | Admitting: Adult Health

## 2018-08-21 ENCOUNTER — Telehealth: Payer: Self-pay

## 2018-08-21 VITALS — BP 116/70 | HR 92 | Temp 97.7°F | Resp 18 | Ht 65.75 in | Wt 207.7 lb

## 2018-08-21 DIAGNOSIS — C50412 Malignant neoplasm of upper-outer quadrant of left female breast: Secondary | ICD-10-CM | POA: Diagnosis present

## 2018-08-21 DIAGNOSIS — Z87891 Personal history of nicotine dependence: Secondary | ICD-10-CM | POA: Insufficient documentation

## 2018-08-21 DIAGNOSIS — M7989 Other specified soft tissue disorders: Secondary | ICD-10-CM | POA: Insufficient documentation

## 2018-08-21 DIAGNOSIS — Z170 Estrogen receptor positive status [ER+]: Secondary | ICD-10-CM | POA: Diagnosis not present

## 2018-08-21 DIAGNOSIS — N951 Menopausal and female climacteric states: Secondary | ICD-10-CM

## 2018-08-21 DIAGNOSIS — Z79899 Other long term (current) drug therapy: Secondary | ICD-10-CM

## 2018-08-21 DIAGNOSIS — Z803 Family history of malignant neoplasm of breast: Secondary | ICD-10-CM | POA: Diagnosis not present

## 2018-08-21 DIAGNOSIS — E2839 Other primary ovarian failure: Secondary | ICD-10-CM

## 2018-08-21 DIAGNOSIS — Z79811 Long term (current) use of aromatase inhibitors: Secondary | ICD-10-CM | POA: Diagnosis not present

## 2018-08-21 DIAGNOSIS — Z9013 Acquired absence of bilateral breasts and nipples: Secondary | ICD-10-CM | POA: Diagnosis not present

## 2018-08-21 DIAGNOSIS — Z7901 Long term (current) use of anticoagulants: Secondary | ICD-10-CM | POA: Insufficient documentation

## 2018-08-21 NOTE — Telephone Encounter (Signed)
Doppler scheduled 08/22/18 9 am. Patient aware.

## 2018-08-21 NOTE — Telephone Encounter (Signed)
Gave pt avs and calendar  °

## 2018-08-21 NOTE — Progress Notes (Signed)
CLINIC:  Survivorship   REASON FOR VISIT:  Routine follow-up post-treatment for a recent history of breast cancer.  BRIEF ONCOLOGIC HISTORY:    Malignant neoplasm of upper-outer quadrant of left breast in female, estrogen receptor positive ([name])   02/01/2018 Initial Diagnosis    Screening detected architectural distortion in the left breast with calcifications UOQ, by ultrasound 2.2 cm at 2 o'clock position axilla negative, ultrasound biopsy revealed invasive lobular cancer grade 1 with LCIS ER 95% PR 100%, Ki-67 1%, HER-2 negative ratio 1.17, T2N0 stage Ib AJCC 8    04/27/2018 Surgery    Bilateral mastectomies: Left mastectomy: Multifocal invasive lobular cancer grade 2, largest 2.1 cm, margins negative, LCIS, 0/1 lymph node ER 95%, PR 100%, HER-2 negative, Ki-67 1% T2N0; right mastectomy: [facility]    05/05/2018 Cancer Staging    Staging form: Breast, AJCC 8th Edition - Pathologic: Stage IA (pT2(2), pN0(sn), cM0, G2, ER+, PR+, HER2-) - Signed by Nicholas Lose, MD on 05/05/2018    04/2018 -  Anti-estrogen oral therapy    Letrozole daily     INTERVAL HISTORY:  Julie [name] presents to the [facility] today for our initial meeting to review her survivorship care plan detailing her treatment course for breast cancer, as well as monitoring long-term side effects of that treatment, education regarding health maintenance, screening, and overall wellness and health promotion.     Overall, Julie [name] reports feeling quite well.  She is taking Letrozole daily.  She notes she is tolerating this well.  She has had intermittent hot flashes.  She doesn't note vaginal dryness.  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  She notes a new left upper arm pain and swelling that has been present since her surgery last week.      REVIEW OF SYSTEMS:  Review of Systems  Constitutional: Negative for appetite change, chills, fatigue, fever and unexpected weight  change.  HENT:   Negative for hearing loss, lump/mass and trouble swallowing.   Eyes: Negative for eye problems and icterus.  Respiratory: Negative for chest tightness, cough and shortness of breath.   Cardiovascular: Negative for chest pain, leg swelling and palpitations.  Gastrointestinal: Negative for abdominal distention, constipation, diarrhea, nausea and vomiting.  Endocrine: Positive for hot flashes.  Skin: Negative for itching and rash.  Neurological: Negative for dizziness, extremity weakness, headaches and numbness.  Hematological: Negative for adenopathy.  Psychiatric/Behavioral: Negative for depression. The patient is not nervous/anxious.    Breast: Denies any new nodularity, masses, tenderness, nipple changes, or nipple discharge.      ONCOLOGY TREATMENT TEAM:  1. Surgeon:  Dr. Brantley Stage at [facility] 2. Medical Oncologist: Dr. Lindi Adie  3. Radiation Oncologist: Dr. Isidore Moos    PAST MEDICAL/SURGICAL HISTORY:  Past Medical History:  Diagnosis Date  . Depression   . DVT (deep venous thrombosis) ([place])   . Torn meniscus    Past Surgical History:  Procedure Laterality Date  . BREAST RECONSTRUCTION WITH PLACEMENT OF TISSUE EXPANDER AND FLEX HD (ACELLULAR HYDRATED DERMIS) Bilateral 04/27/2018   Procedure: Bilateral BREAST RECONSTRUCTION WITH PLACEMENT OF TISSUE EXPANDER AND FLEX HD (ACELLULAR HYDRATED DERMIS);  Surgeon: Irene Limbo, MD;  Location: [place];  Service: Plastics;  Laterality: Bilateral;  . LESION REMOVAL Right 08/11/2018   Procedure: EXCISION OF SKIN LESION FROM LOWER ABDOMEN;  Surgeon: Irene Limbo, MD;  Location: [place];  Service: Plastics;  Laterality: Right;  . LIPOSUCTION WITH LIPOFILLING Bilateral 08/11/2018  Procedure: LIPOSUCTION WITH LIPOFILLING FROM ABDOMEN TO CHEST;  Surgeon: Irene Limbo, MD;  Location: [place];  Service: Plastics;  Laterality: Bilateral;  . MASTECTOMY W/  SENTINEL NODE BIOPSY Bilateral 04/27/2018   Procedure: BILATERAL NIPPLE SPARING MASTECTOMIES WITH LEFT SENTINAL LYMPH NODE BIOPSY;  Surgeon: Erroll Luna, MD;  Location: [place];  Service: General;  Laterality: Bilateral;  . REMOVAL OF TISSUE EXPANDER AND PLACEMENT OF IMPLANT Bilateral 08/11/2018   Procedure: REMOVAL OF BILATERAL TISSUE EXPANDERS AND PLACEMENT OF SILICONE  IMPLANTS;  Surgeon: Irene Limbo, MD;  Location: [place];  Service: Plastics;  Laterality: Bilateral;  . TONSILLECTOMY    . TUBAL LIGATION       ALLERGIES:  No Known Allergies   CURRENT MEDICATIONS:  Outpatient Encounter Medications as of 08/21/2018  Medication Sig  . buPROPion (WELLBUTRIN XL) 300 MG 24 hr tablet Take by mouth.  . letrozole (FEMARA) 2.5 MG tablet Take 1 tablet (2.5 mg total) by mouth daily.  . rivaroxaban (XARELTO) 10 MG TABS tablet Take 10 mg by mouth daily.  . Vilazodone HCl (VIIBRYD) 40 MG TABS Take by mouth.  . [DISCONTINUED] enoxaparin (LOVENOX) 100 MG/ML injection Inject 1 mL (100 mg total) into the skin daily. (Patient not taking: Reported on 08/21/2018)   No facility-administered encounter medications on file as of 08/21/2018.      ONCOLOGIC FAMILY HISTORY:  Family History  Problem Relation Age of Onset  . Breast cancer Mother   . Breast cancer Paternal Aunt      GENETIC COUNSELING/TESTING: Not at this time  SOCIAL HISTORY:  Social History   Socioeconomic History  . Marital status: Divorced    Spouse name: Not on file  . Number of children: Not on file  . Years of education: Not on file  . Highest education level: Not on file  Occupational History  . Not on file  Social Needs  . Financial resource strain: Not on file  . Food insecurity:    Worry: Not on file    Inability: Not on file  . Transportation needs:    Medical: Not on file    Non-medical: Not on file  Tobacco Use  . Smoking status: Former Smoker    Packs/day: 1.00     Types: Cigarettes    Last attempt to quit: 02/01/2018    Years since quitting: 0.5  . Smokeless tobacco: Never Used  Substance and Sexual Activity  . Alcohol use: Yes    Comment: social  . Drug use: No  . Sexual activity: Not on file  Lifestyle  . Physical activity:    Days per week: Not on file    Minutes per session: Not on file  . Stress: Not on file  Relationships  . Social connections:    Talks on phone: Not on file    Gets together: Not on file    Attends religious service: Not on file    Active member of club or organization: Not on file    Attends meetings of clubs or organizations: Not on file    Relationship status: Not on file  . Intimate partner violence:    Fear of current or ex partner: Not on file    Emotionally abused: Not on file    Physically abused: Not on file    Forced sexual activity: Not on file  Other Topics Concern  . Not on file  Social History Narrative  . Not on file  PHYSICAL EXAMINATION:  Vital Signs:   Vitals:   08/21/18 1143  BP: 116/70  Pulse: 92  Resp: 18  Temp: 97.7 F (36.5 C)  SpO2: 96%   Filed Weights   08/21/18 1143  Weight: 207 lb 11.2 oz (94.2 kg)   General: Well-nourished, well-appearing female in no acute distress.  She is unaccompanied today.   HEENT: Head is normocephalic.  Pupils equal and reactive to light. Conjunctivae clear without exudate.  Sclerae anicteric. Oral mucosa is pink, moist.  Oropharynx is pink without lesions or erythema.  Lymph: No cervical, supraclavicular, or infraclavicular lymphadenopathy noted on palpation.  Cardiovascular: Regular rate and rhythm.Marland Kitchen Respiratory: Clear to auscultation bilaterally. Chest expansion symmetric; breathing non-labored.  Breasts: bilateral breasts s/p implant placement, no sign of recurrence, bruising noted on breasts GI: Abdomen soft and round; non-tender, non-distended. Bowel sounds normoactive.  GU: Deferred.  Neuro: No focal deficits. Steady gait.  Psych:  Mood and affect normal and appropriate for situation.  Extremities: Left upper arm with mild swelling noted MSK: No focal spinal tenderness to palpation.  Full range of motion in bilateral upper extremities Skin: Warm and dry.  LABORATORY DATA:  None for this visit.  DIAGNOSTIC IMAGING:  None for this visit.      ASSESSMENT AND PLAN:  Ms.. [name] is a pleasant 67 y.o. female with Stage IA left breast invasive ductal carcinoma, ER+/PR+/HER2-, diagnosed in  01/2018, treated with bilateral mastectomies, and anti-estrogen therapy with Letrozole beginning in 04/2018.  She presents to the Survivorship Clinic for our initial meeting and routine follow-up post-completion of treatment for breast cancer.    1. Stage IA left breast cancer:  Julie [name] is continuing to recover from definitive treatment for breast cancer. She will follow-up with her medical oncologist, Dr. Lindi Adie in 6 months with history and physical exam per surveillance protocol.  She will continue her anti-estrogen therapy with Letrozole. Thus far, she is tolerating the Letrozole well, with minimal side effects. SToday, a comprehensive survivorship care plan and treatment summary was reviewed with the patient today detailing her breast cancer diagnosis, treatment course, potential late/long-term effects of treatment, appropriate follow-up care with recommendations for the future, and patient education resources.  A copy of this summary, along with a letter will be sent to the patient's primary care provider via mail/fax/In Basket message after today's visit.    2.  Bone health:  Given Julie [name]'s age/history of breast cancer and her current treatment regimen including anti-estrogen therapy with Letrozole, she is at risk for bone demineralization.  She is due for bone density testing and I ordered this on her today.  She was given education on specific activities to promote bone health.  3. Cancer screening:  Due to Ms.  Broker's history and her age, she should receive screening for skin cancers, colon cancer, and gynecologic cancers.  The information and recommendations are listed on the patient's comprehensive care plan/treatment summary and were reviewed in detail with the patient.    4. Health maintenance and wellness promotion: Julie [name] was encouraged to consume 5-7 servings of fruits and vegetables per day. We reviewed the "Nutrition Rainbow" handout, as well as the handout "Take Control of Your Health and Reduce Your Cancer Risk" from the [place].  She was also encouraged to engage in moderate to vigorous exercise for 30 minutes per day most days of the week. We discussed the [employer] fitness program, which is designed for cancer survivors to help them become more physically fit  after cancer treatments.  She was instructed to limit her alcohol consumption and continue to abstain from tobacco use.     5. Support services/counseling: It is not uncommon for this period of the patient's cancer care trajectory to be one of many emotions and stressors.  We discussed an opportunity for her to participate in the next session of [facility] ("Finding Your New Normal") support group series designed for patients after they have completed treatment.   Julie [name] was encouraged to take advantage of our many other support services programs, support groups, and/or counseling in coping with her new life as a cancer survivor after completing anti-cancer treatment.  She was given information regarding our available services and encouraged to contact me with any questions or for help enrolling in any of our support group/programs.   6. Left upper arm swelling: Doppler ordered of left upper arm.     Dispo:   -Return to cancer center in 6 months for f/u  -bone density ordered -Follow up with Dr. Brantley Stage next week -She is welcome to return back to the Survivorship Clinic at any time; no additional follow-up  needed at this time.  -Consider referral back to survivorship as a long-term survivor for continued surveillance  A total of (30) minutes of face-to-face time was spent with this patient with greater than 50% of that time in counseling and care-coordination.   Gardenia Phlegm, NP Survivorship Program [place] [phone]   Note: PRIMARY CARE PROVIDER Deland Pretty, [place] [phone]

## 2018-08-22 ENCOUNTER — Ambulatory Visit (HOSPITAL_COMMUNITY)
Admission: RE | Admit: 2018-08-22 | Discharge: 2018-08-22 | Disposition: A | Discharge status: Home / Self Care | Payer: 59 | Source: Ambulatory Visit | Attending: Adult Health | Admitting: Adult Health

## 2018-08-22 DIAGNOSIS — M7989 Other specified soft tissue disorders: Secondary | ICD-10-CM | POA: Insufficient documentation

## 2018-08-22 NOTE — Progress Notes (Signed)
LUE venous duplex prelim: negative for DVT and SVT. Landry Mellow, RDMS, RVT  Called results to Mendel Ryder, NP

## 2018-09-01 ENCOUNTER — Other Ambulatory Visit: Payer: Self-pay | Admitting: Surgery

## 2018-09-01 DIAGNOSIS — R2232 Localized swelling, mass and lump, left upper limb: Secondary | ICD-10-CM

## 2018-09-10 ENCOUNTER — Inpatient Hospital Stay
Admission: RE | Admit: 2018-09-10 | Discharge: 2018-09-10 | Disposition: A | Discharge status: Home / Self Care | Payer: 59 | Source: Ambulatory Visit | Attending: Surgery | Admitting: Surgery

## 2018-09-21 ENCOUNTER — Ambulatory Visit
Admission: RE | Admit: 2018-09-21 | Discharge: 2018-09-21 | Disposition: A | Discharge status: Home / Self Care | Payer: Medicare Other | Source: Ambulatory Visit | Attending: Surgery | Admitting: Surgery

## 2018-09-21 DIAGNOSIS — R2232 Localized swelling, mass and lump, left upper limb: Secondary | ICD-10-CM

## 2018-09-21 MED ORDER — GADOBENATE DIMEGLUMINE 529 MG/ML IV SOLN
20.0000 mL | Freq: Once | INTRAVENOUS | Status: AC | PRN
  Administered 2018-09-21: 20 mL via INTRAVENOUS

## 2018-11-01 DIAGNOSIS — M25561 Pain in right knee: Secondary | ICD-10-CM | POA: Diagnosis not present

## 2018-11-15 DIAGNOSIS — R2231 Localized swelling, mass and lump, right upper limb: Secondary | ICD-10-CM | POA: Diagnosis not present

## 2019-01-05 ENCOUNTER — Telehealth: Payer: Self-pay

## 2019-01-05 MED ORDER — LETROZOLE 2.5 MG PO TABS: 2.5000 mg | ORAL_TABLET | Freq: Every day | ORAL | 0 refills | Status: DC

## 2019-01-05 NOTE — Telephone Encounter (Signed)
Patient requesting to reschedule upcoming appointment d/t being out of town.  Nurse reschedule appointment.    Pt requesting refill on anti estrogen to [facility] in [place] - Medication sent appropriately.

## 2019-01-31 ENCOUNTER — Ambulatory Visit: Payer: 59 | Admitting: Hematology and Oncology

## 2019-02-27 ENCOUNTER — Telehealth: Payer: Self-pay

## 2019-02-27 NOTE — Telephone Encounter (Signed)
Pt called to cancel upcoming appointment due to being out of state.  Nurse offered WebEx visit, pt is staying with her Mom and does not want her Mom to know about what is going on.  Appointment canceled, scheduling message sent for reschedule.

## 2019-02-28 ENCOUNTER — Inpatient Hospital Stay: Payer: Medicare Other | Admitting: Hematology and Oncology

## 2019-03-26 ENCOUNTER — Other Ambulatory Visit: Payer: Self-pay | Admitting: Hematology and Oncology

## 2019-04-09 ENCOUNTER — Other Ambulatory Visit: Payer: Self-pay | Admitting: Hematology and Oncology

## 2019-06-14 ENCOUNTER — Telehealth: Payer: Self-pay | Admitting: Hematology and Oncology

## 2019-06-14 NOTE — Assessment & Plan Note (Signed)
04/27/2018:Bilateral mastectomies: Left mastectomy: Multifocal invasive lobular cancer grade 2, largest 2.1 cm smaller focus 0.7 cm, margins negative, LCIS, 0/1 lymph node ER 95%, PR 100%, HER-2 negative, Ki-67 1% T2N0;  Right mastectomy: [facility] Staging: T2N0 stage Ia Oncotype DX Recurrence score 19: Distant recurrence at 9 years: 6%, low risk No role of radiation since she had bilateral mastectomies  Current treatment: Letrozole 2.5 mg daily Letrozole toxicities:  Breast cancer surveillance: No role of imaging since she had bilateral mastectomies Bone density: 08/31/2018: T score +1: Normal  Return to clinic in 1 year for follow-up

## 2019-06-14 NOTE — Telephone Encounter (Signed)
I talk with patient regarding video visit °

## 2019-06-18 DIAGNOSIS — M25562 Pain in left knee: Secondary | ICD-10-CM | POA: Diagnosis not present

## 2019-06-18 DIAGNOSIS — M1712 Unilateral primary osteoarthritis, left knee: Secondary | ICD-10-CM | POA: Diagnosis not present

## 2019-06-19 NOTE — Progress Notes (Signed)
HEMATOLOGY-ONCOLOGY MYCHART VIDEO VISIT PROGRESS NOTE  I connected with Julie [name] on 06/20/2019 at  3:00 PM EDT by MyChart video conference and verified that I am speaking with the correct person using two identifiers.  I discussed the limitations, risks, security and privacy concerns of performing an evaluation and management service by MyChart and the availability of in person appointments.  I also discussed with the patient that there may be a patient responsible charge related to this service. The patient expressed understanding and agreed to proceed.  Patient's Location: Home Physician Location: Clinic  CHIEF COMPLIANT: Follow-up of left breast cancer on letrozole.   INTERVAL HISTORY: Julie [name] is a 68 y.o. female with above-mentioned history of left breast cancer treated with bilateral mastectomies, and who is currently on anti-estrogen therapy with letrozole. I last saw her a year ago, and in the interim she was seen by Wilber Bihari, NP for [facility]. She presents over MyChart today for follow-up.    Oncology History  Malignant neoplasm of upper-outer quadrant of left breast in female, estrogen receptor positive ([place])  02/01/2018 Initial Diagnosis   Screening detected architectural distortion in the left breast with calcifications UOQ, by ultrasound 2.2 cm at 2 o'clock position axilla negative, ultrasound biopsy revealed invasive lobular cancer grade 1 with LCIS ER 95% PR 100%, Ki-67 1%, HER-2 negative ratio 1.17, T2N0 stage Ib AJCC 8   04/27/2018 Surgery   Bilateral mastectomies: Left mastectomy: Multifocal invasive lobular cancer grade 2, largest 2.1 cm, margins negative, LCIS, 0/1 lymph node ER 95%, PR 100%, HER-2 negative, Ki-67 1% T2N0; right mastectomy: [facility]   04/27/2018 Oncotype testing   Recurrence score 19: Distant recurrence at 9 years: 6%, low risk   05/05/2018 Cancer Staging   Staging form: Breast, AJCC 8th Edition - Pathologic: Stage IA  (pT2(2), pN0(sn), cM0, G2, ER+, PR+, HER2-) - Signed by Nicholas Lose, MD on 05/05/2018   04/2018 -  Anti-estrogen oral therapy   Letrozole daily     REVIEW OF SYSTEMS:   Constitutional: Denies fevers, chills or abnormal weight loss Eyes: Denies blurriness of vision Ears, nose, mouth, throat, and face: Denies mucositis or sore throat Respiratory: Denies cough, dyspnea or wheezes Cardiovascular: Denies palpitation, chest discomfort Gastrointestinal:  Denies nausea, heartburn or change in bowel habits Skin: Denies abnormal skin rashes Lymphatics: Denies new lymphadenopathy or easy bruising Neurological:Denies numbness, tingling or new weaknesses Behavioral/Psych: Mood is stable, no new changes  Extremities: No lower extremity edema Breast: denies any pain or lumps or nodules in either breasts All other systems were reviewed with the patient and are negative.  Observations/Objective:  There were no vitals filed for this visit. There is no height or weight on file to calculate BMI.  I have reviewed the data as listed CMP Latest Ref Rng & Units 04/21/2018 02/08/2018 06/09/2017  Glucose 65 - 99 mg/dL 102(H) 123 117(H)  BUN 6 - 20 mg/dL '13 14 16  '$ Creatinine 0.44 - 1.00 mg/dL 1.21(H) 1.16(H) 1.16(H)  Sodium 135 - 145 mmol/L 144 143 139  Potassium 3.5 - 5.1 mmol/L 4.7 4.5 3.6  Chloride 101 - 111 mmol/L 109 109 107  CO2 22 - 32 mmol/L '28 28 26  '$ Calcium 8.9 - 10.3 mg/dL 9.3 9.8 9.1  Total Protein 6.5 - 8.1 g/dL 7.0 7.3 -  Total Bilirubin 0.3 - 1.2 mg/dL 0.7 0.7 -  Alkaline Phos 38 - 126 U/L 70 80 -  AST 15 - 41 U/L 16 18 -  ALT 14 -  54 U/L 20 24 -    Lab Results  Component Value Date   WBC 7.8 04/21/2018   HGB 14.1 04/21/2018   HCT 44.7 04/21/2018   MCV 93.5 04/21/2018   PLT 221 04/21/2018   NEUTROABS 4.8 04/21/2018      Assessment Plan:  Malignant neoplasm of upper-outer quadrant of left breast in female, estrogen receptor positive ([place]) 04/27/2018:Bilateral mastectomies: Left  mastectomy: Multifocal invasive lobular cancer grade 2, largest 2.1 cm smaller focus 0.7 cm, margins negative, LCIS, 0/1 lymph node ER 95%, PR 100%, HER-2 negative, Ki-67 1% T2N0;  Right mastectomy: ALH Staging: T2N0 stage Ia Oncotype DX Recurrence score 19: Distant recurrence at 9 years: 6%, low risk No role of radiation since she had bilateral mastectomies  Current treatment: Letrozole 2.5 mg daily June 2019 Letrozole toxicities: 1. Occ hot flashes  Breast cancer surveillance: No role of imaging since she had bilateral mastectomies Bone density: 08/31/2018: T score +1: Normal  Return to clinic in 1 year for follow-up  I discussed the assessment and treatment plan with the patient. The patient was provided an opportunity to ask questions and all were answered. The patient agreed with the plan and demonstrated an understanding of the instructions. The patient was advised to call back or seek an in-person evaluation if the symptoms worsen or if the condition fails to improve as anticipated.   I provided 15 minutes of face-to-face MyChart video visit time during this encounter.    Rulon Eisenmenger, MD 06/20/2019   I, Molly Dorshimer, am acting as scribe for Nicholas Lose, MD.  I have reviewed the above documentation for accuracy and completeness, and I agree with the above.

## 2019-06-20 ENCOUNTER — Inpatient Hospital Stay: Payer: Medicare Other | Attending: Hematology and Oncology | Admitting: Hematology and Oncology

## 2019-06-20 DIAGNOSIS — C50412 Malignant neoplasm of upper-outer quadrant of left female breast: Secondary | ICD-10-CM | POA: Diagnosis not present

## 2019-06-20 DIAGNOSIS — Z170 Estrogen receptor positive status [ER+]: Secondary | ICD-10-CM | POA: Diagnosis not present

## 2019-06-20 DIAGNOSIS — Z79811 Long term (current) use of aromatase inhibitors: Secondary | ICD-10-CM | POA: Diagnosis not present

## 2019-06-20 DIAGNOSIS — Z9013 Acquired absence of bilateral breasts and nipples: Secondary | ICD-10-CM

## 2019-06-20 MED ORDER — LETROZOLE 2.5 MG PO TABS: ORAL_TABLET | ORAL | 3 refills | Status: DC

## 2019-06-25 DIAGNOSIS — Z853 Personal history of malignant neoplasm of breast: Secondary | ICD-10-CM | POA: Diagnosis not present

## 2019-06-25 DIAGNOSIS — Z9013 Acquired absence of bilateral breasts and nipples: Secondary | ICD-10-CM | POA: Diagnosis not present

## 2019-06-27 DIAGNOSIS — M25562 Pain in left knee: Secondary | ICD-10-CM | POA: Diagnosis not present

## 2019-07-04 DIAGNOSIS — M1711 Unilateral primary osteoarthritis, right knee: Secondary | ICD-10-CM | POA: Diagnosis not present

## 2019-07-04 DIAGNOSIS — S83282A Other tear of lateral meniscus, current injury, left knee, initial encounter: Secondary | ICD-10-CM | POA: Diagnosis not present

## 2019-07-04 DIAGNOSIS — Z8249 Family history of ischemic heart disease and other diseases of the circulatory system: Secondary | ICD-10-CM | POA: Diagnosis not present

## 2019-07-04 DIAGNOSIS — Z8672 Personal history of thrombophlebitis: Secondary | ICD-10-CM | POA: Diagnosis not present

## 2019-07-04 DIAGNOSIS — M25561 Pain in right knee: Secondary | ICD-10-CM | POA: Diagnosis not present

## 2019-07-04 DIAGNOSIS — Z7902 Long term (current) use of antithrombotics/antiplatelets: Secondary | ICD-10-CM | POA: Diagnosis not present

## 2019-07-04 DIAGNOSIS — S83242A Other tear of medial meniscus, current injury, left knee, initial encounter: Secondary | ICD-10-CM | POA: Diagnosis not present

## 2019-07-04 DIAGNOSIS — M1712 Unilateral primary osteoarthritis, left knee: Secondary | ICD-10-CM | POA: Diagnosis not present

## 2019-07-19 DIAGNOSIS — M94262 Chondromalacia, left knee: Secondary | ICD-10-CM | POA: Diagnosis not present

## 2019-07-19 DIAGNOSIS — S83232A Complex tear of medial meniscus, current injury, left knee, initial encounter: Secondary | ICD-10-CM | POA: Diagnosis not present

## 2019-07-19 DIAGNOSIS — S83272A Complex tear of lateral meniscus, current injury, left knee, initial encounter: Secondary | ICD-10-CM | POA: Diagnosis not present

## 2019-07-26 DIAGNOSIS — M25562 Pain in left knee: Secondary | ICD-10-CM | POA: Diagnosis not present

## 2019-08-01 DIAGNOSIS — M25562 Pain in left knee: Secondary | ICD-10-CM | POA: Diagnosis not present

## 2019-08-08 DIAGNOSIS — M1711 Unilateral primary osteoarthritis, right knee: Secondary | ICD-10-CM | POA: Diagnosis not present

## 2019-08-09 DIAGNOSIS — M25562 Pain in left knee: Secondary | ICD-10-CM | POA: Diagnosis not present

## 2019-08-14 DIAGNOSIS — M25562 Pain in left knee: Secondary | ICD-10-CM | POA: Diagnosis not present

## 2019-08-15 DIAGNOSIS — M1711 Unilateral primary osteoarthritis, right knee: Secondary | ICD-10-CM | POA: Diagnosis not present

## 2019-08-22 DIAGNOSIS — M1711 Unilateral primary osteoarthritis, right knee: Secondary | ICD-10-CM | POA: Diagnosis not present

## 2019-10-09 ENCOUNTER — Other Ambulatory Visit: Payer: Self-pay

## 2019-10-09 MED ORDER — LETROZOLE 2.5 MG PO TABS: ORAL_TABLET | ORAL | 0 refills | Status: DC

## 2020-04-04 ENCOUNTER — Ambulatory Visit: Payer: Medicare Other | Admitting: Hematology and Oncology

## 2020-04-07 DIAGNOSIS — Z853 Personal history of malignant neoplasm of breast: Secondary | ICD-10-CM | POA: Diagnosis not present

## 2020-04-07 DIAGNOSIS — Z9013 Acquired absence of bilateral breasts and nipples: Secondary | ICD-10-CM | POA: Diagnosis not present

## 2020-04-07 NOTE — Progress Notes (Signed)
Patient Care Team: Deland Pretty, MD as PCP - General (Internal Medicine) Irene Limbo, MD as Consulting Physician (Plastic Surgery) Nicholas Lose, MD as Consulting Physician (Hematology and Oncology) Erroll Luna, MD as Consulting Physician (General Surgery) Eppie Gibson, MD as Attending Physician (Radiation Oncology) Gardenia Phlegm, NP as Nurse Practitioner (Hematology and Oncology)  DIAGNOSIS:    ICD-10-CM   1. Malignant neoplasm of upper-outer quadrant of left breast in female, estrogen receptor positive ([place])  C50.412    Z17.0     SUMMARY OF ONCOLOGIC HISTORY: Oncology History  Malignant neoplasm of upper-outer quadrant of left breast in female, estrogen receptor positive ([place])  02/01/2018 Initial Diagnosis   Screening detected architectural distortion in the left breast with calcifications UOQ, by ultrasound 2.2 cm at 2 o'clock position axilla negative, ultrasound biopsy revealed invasive lobular cancer grade 1 with LCIS ER 95% PR 100%, Ki-67 1%, HER-2 negative ratio 1.17, T2N0 stage Ib AJCC 8   04/27/2018 Surgery   Bilateral mastectomies: Left mastectomy: Multifocal invasive lobular cancer grade 2, largest 2.1 cm, margins negative, LCIS, 0/1 lymph node ER 95%, PR 100%, HER-2 negative, Ki-67 1% T2N0; right mastectomy: [facility]   04/27/2018 Oncotype testing   Recurrence score 19: Distant recurrence at 9 years: 6%, low risk   05/05/2018 Cancer Staging   Staging form: Breast, AJCC 8th Edition - Pathologic: Stage IA (pT2(2), pN0(sn), cM0, G2, ER+, PR+, HER2-) - Signed by Nicholas Lose, MD on 05/05/2018   04/2018 -  Anti-estrogen oral therapy   Letrozole daily     CHIEF COMPLIANT: Follow-up after a bilateral mastectomies for left breast cancer  INTERVAL HISTORY: Julie [name] is a 69 y.o. with above-mentioned history of left breast cancer treated with bilateral mastectomies and who is currently on antiestrogen therapy with letrozole. She presents to the clinic  today for follow-up.  She reports no problems tolerating the letrozole therapy.  She has been taking care of her mother who is 23 years old.  If this is wearing her out.  ALLERGIES:  has No Known Allergies.  MEDICATIONS:  Current Outpatient Medications  Medication Sig Dispense Refill  . buPROPion (WELLBUTRIN XL) 300 MG 24 hr tablet Take by mouth.    Marland Kitchen FLUoxetine (PROZAC) 40 MG capsule Take 1 capsule (40 mg total) by mouth daily.  3  . letrozole (FEMARA) 2.5 MG tablet TAKE 1 TABLET(2.5 MG) BY MOUTH DAILY 90 tablet 0  . rivaroxaban (XARELTO) 10 MG TABS tablet Take 10 mg by mouth daily.     No current facility-administered medications for this visit.    PHYSICAL EXAMINATION: ECOG PERFORMANCE STATUS: 1 - Symptomatic but completely ambulatory  Vitals:   04/08/20 1057  BP: (!) 149/71  Pulse: 65  Resp: 20  Temp: 97.8 F (36.6 C)  SpO2: 100%   Filed Weights   04/08/20 1057  Weight: 191 lb 8 oz (86.9 kg)    BREAST: No palpable masses or nodules in either right or left breasts. No palpable axillary supraclavicular or infraclavicular adenopathy no breast tenderness or nipple discharge. (exam performed in the presence of a chaperone)  LABORATORY DATA:  I have reviewed the data as listed CMP Latest Ref Rng & Units 04/21/2018 02/08/2018 06/09/2017  Glucose 65 - 99 mg/dL 102(H) 123 117(H)  BUN 6 - 20 mg/dL '13 14 16  '$ Creatinine 0.44 - 1.00 mg/dL 1.21(H) 1.16(H) 1.16(H)  Sodium 135 - 145 mmol/L 144 143 139  Potassium 3.5 - 5.1 mmol/L 4.7 4.5 3.6  Chloride 101 - 111 mmol/L  109 109 107  CO2 22 - 32 mmol/L '28 28 26  '$ Calcium 8.9 - 10.3 mg/dL 9.3 9.8 9.1  Total Protein 6.5 - 8.1 g/dL 7.0 7.3 -  Total Bilirubin 0.3 - 1.2 mg/dL 0.7 0.7 -  Alkaline Phos 38 - 126 U/L 70 80 -  AST 15 - 41 U/L 16 18 -  ALT 14 - 54 U/L 20 24 -    Lab Results  Component Value Date   WBC 7.8 04/21/2018   HGB 14.1 04/21/2018   HCT 44.7 04/21/2018   MCV 93.5 04/21/2018   PLT 221 04/21/2018   NEUTROABS 4.8  04/21/2018    ASSESSMENT & PLAN:  Malignant neoplasm of upper-outer quadrant of left breast in female, estrogen receptor positive ([place]) 04/27/2018:Bilateral mastectomies: Left mastectomy: Multifocal invasive lobular cancer grade 2, largest 2.1 cmsmaller focus 0.7 cm, margins negative, LCIS, 0/1 lymph node ER 95%, PR 100%, HER-2 negative, Ki-67 1% T2N0; Right mastectomy: ALH Staging: T2N0 stage Ia Oncotype DX Recurrence score 19: Distant recurrence at 9 years: 6%, low risk No role of radiation since she had bilateral mastectomies  Current treatment: Letrozole 2.5 mg daily June 2019  Letrozole toxicities: Denies any adverse effects.  Breast cancer surveillance: No role of imaging since she had bilateral mastectomies Bone density: 08/31/2018: T score +1: Normal  Return to clinic in 1 year for follow-up    No orders of the defined types were placed in this encounter.  The patient has a good understanding of the overall plan. she agrees with it. she will call with any problems that may develop before the next visit here.  Total time spent: 20 mins including face to face time and time spent for planning, charting and coordination of care  Nicholas Lose, MD 04/08/2020  I, Cloyde Reams Dorshimer, am acting as scribe for Dr. Nicholas Lose.  I have reviewed the above documentation for accuracy and completeness, and I agree with the above.

## 2020-04-08 ENCOUNTER — Inpatient Hospital Stay: Payer: Medicare Other | Attending: Hematology and Oncology | Admitting: Hematology and Oncology

## 2020-04-08 ENCOUNTER — Other Ambulatory Visit: Payer: Self-pay

## 2020-04-08 DIAGNOSIS — Z7901 Long term (current) use of anticoagulants: Secondary | ICD-10-CM | POA: Insufficient documentation

## 2020-04-08 DIAGNOSIS — Z79899 Other long term (current) drug therapy: Secondary | ICD-10-CM | POA: Diagnosis not present

## 2020-04-08 DIAGNOSIS — Z79811 Long term (current) use of aromatase inhibitors: Secondary | ICD-10-CM | POA: Insufficient documentation

## 2020-04-08 DIAGNOSIS — Z9013 Acquired absence of bilateral breasts and nipples: Secondary | ICD-10-CM | POA: Diagnosis not present

## 2020-04-08 DIAGNOSIS — C50412 Malignant neoplasm of upper-outer quadrant of left female breast: Secondary | ICD-10-CM | POA: Insufficient documentation

## 2020-04-08 DIAGNOSIS — Z170 Estrogen receptor positive status [ER+]: Secondary | ICD-10-CM | POA: Diagnosis not present

## 2020-04-08 MED ORDER — FLUOXETINE HCL 40 MG PO CAPS: 40.0000 mg | ORAL_CAPSULE | Freq: Every day | ORAL | 3 refills | Status: AC

## 2020-04-08 MED ORDER — LETROZOLE 2.5 MG PO TABS: ORAL_TABLET | ORAL | 3 refills | Status: DC

## 2020-04-08 NOTE — Assessment & Plan Note (Signed)
04/27/2018:Bilateral mastectomies: Left mastectomy: Multifocal invasive lobular cancer grade 2, largest 2.1 cmsmaller focus 0.7 cm, margins negative, LCIS, 0/1 lymph node ER 95%, PR 100%, HER-2 negative, Ki-67 1% T2N0; Right mastectomy: ALH Staging: T2N0 stage Ia Oncotype DX Recurrence score 19: Distant recurrence at 9 years: 6%, low risk No role of radiation since she had bilateral mastectomies  Current treatment: Letrozole 2.5 mg daily June 2019  Letrozole toxicities: 1. Occ hot flashes  Breast cancer surveillance: No role of imaging since she had bilateral mastectomies Bone density: 08/31/2018: T score +1: Normal  Return to clinic in 1 year for follow-up

## 2020-04-11 DIAGNOSIS — H524 Presbyopia: Secondary | ICD-10-CM | POA: Diagnosis not present

## 2020-04-23 DIAGNOSIS — Z9013 Acquired absence of bilateral breasts and nipples: Secondary | ICD-10-CM | POA: Diagnosis not present

## 2020-04-23 DIAGNOSIS — Z853 Personal history of malignant neoplasm of breast: Secondary | ICD-10-CM | POA: Diagnosis not present

## 2020-04-23 NOTE — H&P (Signed)
Subjective:    Patient ID: Julie [name] is a 69 y.o. female.  HPI  2 years post bilateral mastectomies with immediate reconstruction. Plan bilateral reconstruction revision to address implant displacement next month. Weight down 15 lb since time of implant placement. Patient has been in [place] caring for mother and has returned to area through end July.  Presented following screening MMG showing distortion in the left breast with calcifications in the UOQ.  USshowed 2.2 cm mass over UOQ and benign axilla. Biopsy with ILC with LCIS ER/PR+, Her2-.  MRI showed a 2.0 x 1.5 x 1.3 cm biopsy-proven carcinoma over UOQ. In addition multiple areas of clumped, linear enhancement involving all 4 quadrants of the left breast spanning an area measuring 13.5 x 9.0 x 4.3 cm noted. On MRI, left axillary LN with cortical thickening noted. MR guided biopsy of the linear enhancement labeled OLQ with invasive carcinoma and ALH. Second-look left axillary [place] and biopsy negative for carcinoma.  Final pathology right breast ALH, left breast multifocal ILC largest focus 2.1 cm, margins clear, 0/2 SLN negative.  Oncotype 19/no chemo recommended. On Letrozole.  History DVT last 8.2018, prior to that states 10 years prior. No hypercoaguability work up recalled by patient. On Xarelto.   Prior 42 C, happy with this. Left mastectomy 1392 g Right 1436 g  States last cig was day of consult with me.   Worked as Production assistant, radio at [employer], retired now.     Objective:  Physical Exam  Cardiovascular: Normal rate, regular rhythm and normal heart sounds.  Pulmonary/Chest: Effort normal and breath sounds normal.     Chest: soft bilateral no masses bilateral rippling noted, over left chest possible AP malposition as I can appreciate implant stamp centrally In supine position lateral displacement bilateral implants greater on right, with implant position manually corrected still step off noted  superiorly Volume symmetric Soft tissue symmastia centrally  Assessment:    Left breast ca UOQ ER+  S/p bilateral SRM, left SLN, prepectoral TE/ADM (Alloderm) reconstruction S/p silicone implant exchange, lipofilling chest, liposuction bilateral lateral chest    Plan:    Plan revision bilateral reconstruction with additional ADM placement to correct lateral displacement. Reviewed examples of her current capacity filled vs Tru Form 2 and 3 (ie HCG) implant. Reviewed all risk rippling, AP malposition, but the latter may be more clinically noticeable with more cohesive gel implants. Plan change to HCG implants. She desires a bit larger implant and this would be fine.   We discussed additional lipofilling to address rippling and contour. I counseled to defer this for now and limit to above plan.  Completed ASPS consent including physician patient checklist. Discussed off label use ADM in breast reconstruction.   Discussed risk COVID infectionthrough this elective surgery. Patient will receive COVID testing prior to surgery. Discussed even if patient receivesa negative test result, the tests in some cases may fail to detect the virus or patient maycontract COVID after the test.COVID 19 infectionbefore/during/aftersurgery may result in lead to a higher chance of complication and death.  Plan OP surgery, drains. Rx for oxycodone, robaxin, and Bactrim given.   Hold Xarelto 3 d prior.   20 ml fat infiltrated over right chest, 40 ml fat over left chest. Natrelle Inspira Smooth Round Extra Projection 700 ml implants placed bilateral. REF [id number]

## 2020-04-30 ENCOUNTER — Other Ambulatory Visit: Payer: Self-pay

## 2020-04-30 ENCOUNTER — Encounter (HOSPITAL_BASED_OUTPATIENT_CLINIC_OR_DEPARTMENT_OTHER): Payer: Self-pay | Admitting: Plastic Surgery

## 2020-05-02 ENCOUNTER — Other Ambulatory Visit (HOSPITAL_COMMUNITY)
Admission: RE | Admit: 2020-05-02 | Discharge: 2020-05-02 | Disposition: A | Discharge status: Home / Self Care | Payer: Medicare Other | Source: Ambulatory Visit | Attending: Plastic Surgery | Admitting: Plastic Surgery

## 2020-05-02 DIAGNOSIS — Z20822 Contact with and (suspected) exposure to covid-19: Secondary | ICD-10-CM | POA: Diagnosis not present

## 2020-05-02 DIAGNOSIS — Z01812 Encounter for preprocedural laboratory examination: Secondary | ICD-10-CM | POA: Insufficient documentation

## 2020-05-03 LAB — SARS CORONAVIRUS 2 (TAT 6-24 HRS): SARS Coronavirus 2: NEGATIVE

## 2020-05-05 NOTE — Progress Notes (Signed)

## 2020-05-06 ENCOUNTER — Other Ambulatory Visit: Payer: Self-pay

## 2020-05-06 ENCOUNTER — Encounter (HOSPITAL_BASED_OUTPATIENT_CLINIC_OR_DEPARTMENT_OTHER): Payer: Self-pay | Admitting: Plastic Surgery

## 2020-05-06 ENCOUNTER — Ambulatory Visit (HOSPITAL_BASED_OUTPATIENT_CLINIC_OR_DEPARTMENT_OTHER): Payer: Medicare Other | Admitting: Certified Registered"

## 2020-05-06 ENCOUNTER — Encounter (HOSPITAL_BASED_OUTPATIENT_CLINIC_OR_DEPARTMENT_OTHER)
Admission: RE | Disposition: A | Discharge status: Home / Self Care | Payer: Self-pay | Source: Home / Self Care | Attending: Plastic Surgery

## 2020-05-06 ENCOUNTER — Ambulatory Visit (HOSPITAL_BASED_OUTPATIENT_CLINIC_OR_DEPARTMENT_OTHER)
Admission: RE | Admit: 2020-05-06 | Discharge: 2020-05-06 | Disposition: A | Discharge status: Home / Self Care | Payer: Medicare Other | Attending: Plastic Surgery | Admitting: Plastic Surgery

## 2020-05-06 DIAGNOSIS — Z6832 Body mass index (BMI) 32.0-32.9, adult: Secondary | ICD-10-CM | POA: Insufficient documentation

## 2020-05-06 DIAGNOSIS — T8542XA Displacement of breast prosthesis and implant, initial encounter: Secondary | ICD-10-CM | POA: Insufficient documentation

## 2020-05-06 DIAGNOSIS — F329 Major depressive disorder, single episode, unspecified: Secondary | ICD-10-CM | POA: Diagnosis not present

## 2020-05-06 DIAGNOSIS — E669 Obesity, unspecified: Secondary | ICD-10-CM | POA: Diagnosis not present

## 2020-05-06 DIAGNOSIS — Z86718 Personal history of other venous thrombosis and embolism: Secondary | ICD-10-CM | POA: Insufficient documentation

## 2020-05-06 DIAGNOSIS — Z170 Estrogen receptor positive status [ER+]: Secondary | ICD-10-CM | POA: Insufficient documentation

## 2020-05-06 DIAGNOSIS — Z7901 Long term (current) use of anticoagulants: Secondary | ICD-10-CM | POA: Diagnosis not present

## 2020-05-06 DIAGNOSIS — X58XXXA Exposure to other specified factors, initial encounter: Secondary | ICD-10-CM | POA: Insufficient documentation

## 2020-05-06 DIAGNOSIS — N6489 Other specified disorders of breast: Secondary | ICD-10-CM | POA: Diagnosis not present

## 2020-05-06 DIAGNOSIS — Z421 Encounter for breast reconstruction following mastectomy: Secondary | ICD-10-CM | POA: Diagnosis not present

## 2020-05-06 DIAGNOSIS — Z853 Personal history of malignant neoplasm of breast: Secondary | ICD-10-CM | POA: Diagnosis not present

## 2020-05-06 DIAGNOSIS — Z87891 Personal history of nicotine dependence: Secondary | ICD-10-CM | POA: Diagnosis not present

## 2020-05-06 DIAGNOSIS — Z9013 Acquired absence of bilateral breasts and nipples: Secondary | ICD-10-CM | POA: Diagnosis not present

## 2020-05-06 HISTORY — PX: APPLICATION OF A-CELL OF CHEST/ABDOMEN: SHX6302

## 2020-05-06 HISTORY — PX: BREAST IMPLANT EXCHANGE: SHX6296

## 2020-05-06 SURGERY — REPLACEMENT, IMPLANT, BREAST
Anesthesia: General | Site: Breast | Laterality: Bilateral

## 2020-05-06 MED ORDER — DEXAMETHASONE SODIUM PHOSPHATE 4 MG/ML IJ SOLN
INTRAMUSCULAR | Status: DC | PRN
  Administered 2020-05-06: 5 mg via INTRAVENOUS

## 2020-05-06 MED ORDER — PROMETHAZINE HCL 25 MG/ML IJ SOLN: 6.2500 mg | INTRAMUSCULAR | Status: DC | PRN

## 2020-05-06 MED ORDER — DEXAMETHASONE SODIUM PHOSPHATE 10 MG/ML IJ SOLN
INTRAMUSCULAR | Status: AC
  Filled 2020-05-06: qty 1

## 2020-05-06 MED ORDER — HYDROMORPHONE HCL 1 MG/ML IJ SOLN: 0.2500 mg | INTRAMUSCULAR | Status: DC | PRN

## 2020-05-06 MED ORDER — BUPIVACAINE HCL (PF) 0.25 % IJ SOLN
INTRAMUSCULAR | Status: DC | PRN
  Administered 2020-05-06: 30 mL

## 2020-05-06 MED ORDER — LIDOCAINE HCL (CARDIAC) PF 100 MG/5ML IV SOSY
PREFILLED_SYRINGE | INTRAVENOUS | Status: DC | PRN
  Administered 2020-05-06: 60 mg via INTRAVENOUS

## 2020-05-06 MED ORDER — FENTANYL CITRATE (PF) 100 MCG/2ML IJ SOLN
INTRAMUSCULAR | Status: AC
  Filled 2020-05-06: qty 2

## 2020-05-06 MED ORDER — PROPOFOL 10 MG/ML IV BOLUS
INTRAVENOUS | Status: AC
  Filled 2020-05-06: qty 20

## 2020-05-06 MED ORDER — PHENYLEPHRINE 40 MCG/ML (10ML) SYRINGE FOR IV PUSH (FOR BLOOD PRESSURE SUPPORT)
PREFILLED_SYRINGE | INTRAVENOUS | Status: AC
  Filled 2020-05-06: qty 20

## 2020-05-06 MED ORDER — MIDAZOLAM HCL 2 MG/2ML IJ SOLN
INTRAMUSCULAR | Status: AC
  Filled 2020-05-06: qty 2

## 2020-05-06 MED ORDER — ROCURONIUM BROMIDE 10 MG/ML (PF) SYRINGE
PREFILLED_SYRINGE | INTRAVENOUS | Status: AC
  Filled 2020-05-06: qty 10

## 2020-05-06 MED ORDER — LACTATED RINGERS IV SOLN: INTRAVENOUS | Status: DC

## 2020-05-06 MED ORDER — GABAPENTIN 300 MG PO CAPS
ORAL_CAPSULE | ORAL | Status: AC
  Filled 2020-05-06: qty 1

## 2020-05-06 MED ORDER — GABAPENTIN 300 MG PO CAPS
300.0000 mg | ORAL_CAPSULE | ORAL | Status: AC
  Administered 2020-05-06: 300 mg via ORAL

## 2020-05-06 MED ORDER — ACETAMINOPHEN 500 MG PO TABS
ORAL_TABLET | ORAL | Status: AC
  Filled 2020-05-06: qty 2

## 2020-05-06 MED ORDER — ROCURONIUM BROMIDE 100 MG/10ML IV SOLN
INTRAVENOUS | Status: DC | PRN
  Administered 2020-05-06: 70 mg via INTRAVENOUS

## 2020-05-06 MED ORDER — CHLORHEXIDINE GLUCONATE CLOTH 2 % EX PADS: 6.0000 | MEDICATED_PAD | Freq: Once | CUTANEOUS | Status: DC

## 2020-05-06 MED ORDER — LIDOCAINE 2% (20 MG/ML) 5 ML SYRINGE
INTRAMUSCULAR | Status: AC
  Filled 2020-05-06: qty 10

## 2020-05-06 MED ORDER — ACETAMINOPHEN 500 MG PO TABS
1000.0000 mg | ORAL_TABLET | ORAL | Status: AC
  Administered 2020-05-06: 1000 mg via ORAL

## 2020-05-06 MED ORDER — MIDAZOLAM HCL 5 MG/5ML IJ SOLN
INTRAMUSCULAR | Status: DC | PRN
  Administered 2020-05-06: 1 mg via INTRAVENOUS

## 2020-05-06 MED ORDER — MEPERIDINE HCL 25 MG/ML IJ SOLN: 6.2500 mg | INTRAMUSCULAR | Status: DC | PRN

## 2020-05-06 MED ORDER — ONDANSETRON HCL 4 MG/2ML IJ SOLN
INTRAMUSCULAR | Status: AC
  Filled 2020-05-06: qty 8

## 2020-05-06 MED ORDER — PHENYLEPHRINE 40 MCG/ML (10ML) SYRINGE FOR IV PUSH (FOR BLOOD PRESSURE SUPPORT)
PREFILLED_SYRINGE | INTRAVENOUS | Status: AC
  Filled 2020-05-06: qty 10

## 2020-05-06 MED ORDER — PROPOFOL 10 MG/ML IV BOLUS
INTRAVENOUS | Status: DC | PRN
  Administered 2020-05-06: 150 mg via INTRAVENOUS

## 2020-05-06 MED ORDER — SODIUM CHLORIDE 0.9 % IV SOLN
INTRAVENOUS | Status: DC | PRN
  Administered 2020-05-06: 1000 mL

## 2020-05-06 MED ORDER — ONDANSETRON HCL 4 MG/2ML IJ SOLN
INTRAMUSCULAR | Status: DC | PRN
  Administered 2020-05-06: 4 mg via INTRAVENOUS

## 2020-05-06 MED ORDER — PROPOFOL 500 MG/50ML IV EMUL
INTRAVENOUS | Status: DC | PRN
Start: 2020-05-06 — End: 2020-05-06
  Administered 2020-05-06: 25 ug/kg/min via INTRAVENOUS

## 2020-05-06 MED ORDER — SUGAMMADEX SODIUM 200 MG/2ML IV SOLN
INTRAVENOUS | Status: DC | PRN
Start: 2020-05-06 — End: 2020-05-06
  Administered 2020-05-06: 200 mg via INTRAVENOUS

## 2020-05-06 MED ORDER — PROMETHAZINE HCL 25 MG/ML IJ SOLN
INTRAMUSCULAR | Status: AC
  Filled 2020-05-06: qty 1

## 2020-05-06 MED ORDER — OXYCODONE HCL 5 MG PO TABS
ORAL_TABLET | ORAL | Status: AC
  Filled 2020-05-06: qty 1

## 2020-05-06 MED ORDER — OXYCODONE HCL 5 MG/5ML PO SOLN: 5.0000 mg | Freq: Once | ORAL | Status: AC | PRN

## 2020-05-06 MED ORDER — EPHEDRINE 5 MG/ML INJ
INTRAVENOUS | Status: AC
  Filled 2020-05-06: qty 30

## 2020-05-06 MED ORDER — EPHEDRINE SULFATE 50 MG/ML IJ SOLN
INTRAMUSCULAR | Status: DC | PRN
  Administered 2020-05-06 (×3): 10 mg via INTRAVENOUS

## 2020-05-06 MED ORDER — PROPOFOL 500 MG/50ML IV EMUL
INTRAVENOUS | Status: AC
  Filled 2020-05-06: qty 100

## 2020-05-06 MED ORDER — OXYCODONE HCL 5 MG PO TABS
5.0000 mg | ORAL_TABLET | Freq: Once | ORAL | Status: AC | PRN
  Administered 2020-05-06: 5 mg via ORAL

## 2020-05-06 MED ORDER — FENTANYL CITRATE (PF) 100 MCG/2ML IJ SOLN
INTRAMUSCULAR | Status: DC | PRN
  Administered 2020-05-06: 50 ug via INTRAVENOUS

## 2020-05-06 MED ORDER — CEFAZOLIN SODIUM-DEXTROSE 2-4 GM/100ML-% IV SOLN
2.0000 g | INTRAVENOUS | Status: AC
  Administered 2020-05-06: 2 g via INTRAVENOUS

## 2020-05-06 MED ORDER — CEFAZOLIN SODIUM-DEXTROSE 2-4 GM/100ML-% IV SOLN
INTRAVENOUS | Status: AC
  Filled 2020-05-06: qty 100

## 2020-05-06 MED ORDER — SODIUM CHLORIDE 0.9 % IV SOLN
INTRAVENOUS | Status: DC | PRN
  Administered 2020-05-06: 40 ug via INTRAVENOUS
  Administered 2020-05-06 (×2): 80 ug via INTRAVENOUS
  Administered 2020-05-06: 40 ug via INTRAVENOUS

## 2020-05-06 SURGICAL SUPPLY — 90 items
ALLODERM 8X16 MED THICK (Tissue) ×6 IMPLANT
BAG DECANTER FOR FLEXI CONT (MISCELLANEOUS) ×3 IMPLANT
BINDER ABDOMINAL 10 UNV 27-48 (MISCELLANEOUS) ×3 IMPLANT
BINDER ABDOMINAL 12 SM 30-45 (SOFTGOODS) IMPLANT
BINDER BREAST LRG (GAUZE/BANDAGES/DRESSINGS) IMPLANT
BINDER BREAST MEDIUM (GAUZE/BANDAGES/DRESSINGS) IMPLANT
BINDER BREAST XLRG (GAUZE/BANDAGES/DRESSINGS) IMPLANT
BINDER BREAST XXLRG (GAUZE/BANDAGES/DRESSINGS) ×3 IMPLANT
BLADE SURG 10 STRL SS (BLADE) ×9 IMPLANT
BLADE SURG 11 STRL SS (BLADE) IMPLANT
BNDG GAUZE ELAST 4 BULKY (GAUZE/BANDAGES/DRESSINGS) ×6 IMPLANT
CANISTER LIPO FAT HARVEST (MISCELLANEOUS) IMPLANT
CANISTER SUCT 1200ML W/VALVE (MISCELLANEOUS) ×3 IMPLANT
CHLORAPREP W/TINT 26 (MISCELLANEOUS) ×6 IMPLANT
COVER BACK TABLE 60X90IN (DRAPES) ×3 IMPLANT
COVER MAYO STAND STRL (DRAPES) ×9 IMPLANT
COVER WAND RF STERILE (DRAPES) IMPLANT
DECANTER SPIKE VIAL GLASS SM (MISCELLANEOUS) ×3 IMPLANT
DERMABOND ADVANCED (GAUZE/BANDAGES/DRESSINGS) ×8
DERMABOND ADVANCED .7 DNX12 (GAUZE/BANDAGES/DRESSINGS) ×4 IMPLANT
DRAIN CHANNEL 15F RND FF W/TCR (WOUND CARE) IMPLANT
DRAIN CHANNEL 19F RND (DRAIN) ×6 IMPLANT
DRAPE INCISE IOBAN 66X45 STRL (DRAPES) IMPLANT
DRAPE TOP ARMCOVERS (MISCELLANEOUS) ×3 IMPLANT
DRAPE U-SHAPE 76X120 STRL (DRAPES) ×3 IMPLANT
DRAPE UTILITY XL STRL (DRAPES) ×6 IMPLANT
DRSG PAD ABDOMINAL 8X10 ST (GAUZE/BANDAGES/DRESSINGS) ×6 IMPLANT
ELECT BLADE 4.0 EZ CLEAN MEGAD (MISCELLANEOUS) ×3
ELECT COATED BLADE 2.86 ST (ELECTRODE) ×3 IMPLANT
ELECT REM PT RETURN 9FT ADLT (ELECTROSURGICAL) ×3
ELECTRODE BLDE 4.0 EZ CLN MEGD (MISCELLANEOUS) ×1 IMPLANT
ELECTRODE REM PT RTRN 9FT ADLT (ELECTROSURGICAL) ×1 IMPLANT
EVACUATOR SILICONE 100CC (DRAIN) ×6 IMPLANT
EXTRACTOR CANIST REVOLVE STRL (CANNISTER) IMPLANT
GLOVE BIO SURGEON STRL SZ 6 (GLOVE) ×9 IMPLANT
GLOVE BIO SURGEON STRL SZ 6.5 (GLOVE) ×2 IMPLANT
GLOVE BIO SURGEONS STRL SZ 6.5 (GLOVE) ×1
GLOVE BIOGEL PI IND STRL 6.5 (GLOVE) ×1 IMPLANT
GLOVE BIOGEL PI INDICATOR 6.5 (GLOVE) ×2
GOWN STRL REUS W/ TWL LRG LVL3 (GOWN DISPOSABLE) ×2 IMPLANT
GOWN STRL REUS W/TWL LRG LVL3 (GOWN DISPOSABLE) ×6
IMPL BREAST P6.7X FULL 750 (Breast) ×2 IMPLANT
IMPL BRST P6.7X FULL 750CC (Breast) ×2 IMPLANT
IMPLANT BREAST GEL 750CC (Breast) ×6 IMPLANT
IV NS 500ML (IV SOLUTION)
IV NS 500ML BAXH (IV SOLUTION) IMPLANT
KIT FILL SYSTEM UNIVERSAL (SET/KITS/TRAYS/PACK) IMPLANT
LINER CANISTER 1000CC FLEX (MISCELLANEOUS) IMPLANT
MARKER SKIN DUAL TIP RULER LAB (MISCELLANEOUS) IMPLANT
NDL SAFETY ECLIPSE 18X1.5 (NEEDLE) IMPLANT
NEEDLE FILTER BLUNT 18X 1/2SAF (NEEDLE) ×2
NEEDLE FILTER BLUNT 18X1 1/2 (NEEDLE) ×1 IMPLANT
NEEDLE HYPO 18GX1.5 SHARP (NEEDLE)
NEEDLE HYPO 25X1 1.5 SAFETY (NEEDLE) ×3 IMPLANT
NS IRRIG 1000ML POUR BTL (IV SOLUTION) ×3 IMPLANT
PAD ALCOHOL SWAB (MISCELLANEOUS) IMPLANT
PENCIL SMOKE EVACUATOR (MISCELLANEOUS) ×3 IMPLANT
PIN SAFETY STERILE (MISCELLANEOUS) ×3 IMPLANT
PUNCH BIOPSY DERMAL 4MM (MISCELLANEOUS) ×3 IMPLANT
SET BASIN DAY SURGERY F.S. (CUSTOM PROCEDURE TRAY) ×3 IMPLANT
SHEET MEDIUM DRAPE 40X70 STRL (DRAPES) ×6 IMPLANT
SLEEVE SCD COMPRESS KNEE MED (MISCELLANEOUS) ×3 IMPLANT
SPONGE LAP 18X18 RF (DISPOSABLE) ×6 IMPLANT
STAPLER VISISTAT 35W (STAPLE) ×3 IMPLANT
SUT ETHILON 2 0 FS 18 (SUTURE) ×6 IMPLANT
SUT MNCRL AB 4-0 PS2 18 (SUTURE) ×6 IMPLANT
SUT PDS 3-0 CT2 (SUTURE) ×18
SUT PDS AB 2-0 CT2 27 (SUTURE) IMPLANT
SUT PDS II 3-0 CT2 27 ABS (SUTURE) ×6 IMPLANT
SUT VIC AB 3-0 PS1 18 (SUTURE)
SUT VIC AB 3-0 PS1 18XBRD (SUTURE) IMPLANT
SUT VIC AB 3-0 SH 27 (SUTURE) ×6
SUT VIC AB 3-0 SH 27X BRD (SUTURE) ×2 IMPLANT
SUT VICRYL 4-0 PS2 18IN ABS (SUTURE) ×6 IMPLANT
SYR 10ML LL (SYRINGE) ×3 IMPLANT
SYR 20ML LL LF (SYRINGE) IMPLANT
SYR 50ML LL SCALE MARK (SYRINGE) IMPLANT
SYR BULB IRRIG 60ML STRL (SYRINGE) ×6 IMPLANT
SYR CONTROL 10ML LL (SYRINGE) ×3 IMPLANT
SYR TB 1ML LL NO SAFETY (SYRINGE) IMPLANT
SYR TOOMEY IRRIG 70ML (MISCELLANEOUS)
SYRINGE TOOMEY IRRIG 70ML (MISCELLANEOUS) IMPLANT
TISSUE ALLDRM 8X16 MED THICK (Tissue) ×2 IMPLANT
TOWEL GREEN STERILE FF (TOWEL DISPOSABLE) ×3 IMPLANT
TUBE CONNECTING 20'X1/4 (TUBING) ×1
TUBE CONNECTING 20X1/4 (TUBING) ×2 IMPLANT
TUBING INFILTRATION IT-10001 (TUBING) IMPLANT
TUBING SET GRADUATE ASPIR 12FT (MISCELLANEOUS) IMPLANT
UNDERPAD 30X36 HEAVY ABSORB (UNDERPADS AND DIAPERS) ×6 IMPLANT
YANKAUER SUCT BULB TIP NO VENT (SUCTIONS) ×3 IMPLANT

## 2020-05-06 NOTE — Discharge Instructions (Signed)

## 2020-05-06 NOTE — Transfer of Care (Signed)
Immediate Anesthesia Transfer of Care Note  Patient: Julie [name]  Procedure(s) Performed: REVISION BILATERAL BREAST RECONSTRUCTION WITH BREAST SILICONE IMPLANT EXCHANGE (Bilateral Breast) ACELLULAR DERMIS TO BILATERAL CHEST (Bilateral Breast)  Patient Location: PACU  Anesthesia Type:General  Level of Consciousness: drowsy and patient cooperative  Airway & Oxygen Therapy: Patient Spontanous Breathing and Patient connected to face mask oxygen  Post-op Assessment: Report given to RN and Post -op Vital signs reviewed and stable  Post vital signs: Reviewed and stable  Last Vitals:  Vitals Value Taken Time  BP    Temp    Pulse    Resp 26 05/06/20 1044  SpO2    Vitals shown include unvalidated device data.  Last Pain:  Vitals:   05/06/20 5686  TempSrc: Oral  PainSc: 0-No pain      Patients Stated Pain Goal: 4 (16/83/72 9021)  Complications: No apparent anesthesia complications

## 2020-05-06 NOTE — Op Note (Signed)
Operative Note   DATE OF OPERATION: 6.8.21  LOCATION: [facility]-outpatient  SURGICAL DIVISION: Plastic Surgery  PREOPERATIVE DIAGNOSES:  1. History breast cancer 2. Acquired absence breasts  POSTOPERATIVE DIAGNOSES:  same  PROCEDURE:  1. Revision bilateral breast reconstruction with silicone implant exchange 2. Acellular dermis (Alloderm) to bilateral chest 250 cm2  SURGEON: Irene Limbo MD MBA  ASSISTANT: none  ANESTHESIA:  General.   EBL: 25 ml  COMPLICATIONS: None immediate.   INDICATIONS FOR PROCEDURE:  The patient, Julie [name], is a 69 y.o. female born on January 20, 1951, is here for revision breast reconstruction for inferior and lateral implant displacement, rippling following skin reduction pattern mastectomies and prepectoral implant based reconstruction.    FINDINGS: Removed intact smooth silicone implants bilateral. Both noted to have AP malposition. Placed Natrelle Cohesive gel Smooth Round Extra Projection 750 ml implants bilateral. REF SCX-750 RIGHT SN [id number] LEFT SN [id number]. Both   DESCRIPTION OF PROCEDURE:  The patient's operative site was marked with the patient in the preoperative area. The patient was taken to the operating room. SCDs were placed and IV antibiotics were given. The patient's operative site was prepped and draped in a sterile fashion. A time out was performed and all information was confirmed to be correct. Skin marked over bilateral inframammary fold scar for skin excision. This area deepithelialized. Incision then made through left IMF subcutanesous tissue superficial fascia and capsule. Implant removed. Interrupted 2-0 PDS suture placed from chest wall to superficial fascia of caudal incision in order to stabilize inframammary fold. Acellular dermis prepared and perforated. This was inset to chest wall along desired anterior axillary line with 2-0 PDS suture. The acellular dermis was then sutured to capsule anterior mastectomy flap  medial to desired anterior axillary line. Cavity irrigated with saline solution containing Ancef, gentamicin, and bacitracin. Hemostasis ensured. 19 Fr JP drain placed percutaneously and secured with 2-0 nylon. Cavity irrigated with Betadine saline solution. Implant  Placed in cavity and orientation ensured. Caudal border acellular dermis inset to anterior mastectomy flap capsule with interrupted 2-0 PDS suture. Incision closed with 3-0 vicryl in superficial fascia, 4-0 vicryl in dermis- 4-0 monocryl subcuticular skin closure.   Incision then made in right inframammary fold. Incision carried through dermis subcutanesous tissue superficial fascia and capsule. Implant removed. Interrupted 2-0 PDS suture placed from chest wall to superficial fascia of caudal incision in order to stabilize inframammary fold. Acellular dermis prepared and perforated. This was inset to chest wall along desired anterior axillary line with 2-0 PDS suture. The acellular dermis was then sutured to capsule anterior mastectomy flap medial to desired anterior axillary line. Cavity irrigated with saline solution containing Ancef, gentamicin, and bacitracin. Hemostasis ensured. 19 Fr JP drain placed percutaneously and secured with 2-0 nylon. Cavity irrigated with Betadine saline solution. Implant  Placed in cavity and orientation ensured. Caudal border acellular dermis inset to anterior mastectomy flap capsule with interrupted 2-0 PDS suture. Incision closed with 3-0 vicryl in superficial fascia, 4-0 vicryl in dermis- 4-0 monocryl subcuticular skin closure. Dermabond applied to incisions followed by dry dressing, breast binder.  The patient was allowed to wake from anesthesia, extubated and taken to the recovery room in satisfactory condition.   SPECIMENS: none  DRAINS: 60 Fr JP in right and left breast reconstruction

## 2020-05-06 NOTE — Interval H&P Note (Signed)
History and Physical Interval Note:  05/06/2020 6:50 AM  Julie [name]  has presented today for surgery, with the diagnosis of history breast cancer, acquired absence breasts.  The various methods of treatment have been discussed with the patient and family. After consideration of risks, benefits and other options for treatment, the patient has consented to  Procedure(s): REVISION BILATERAL BREAST RECONSTRUCTION WITH BREAST SILICONE IMPLANT EXCHANGE (Bilateral) ACELLULAR DERMIS TO BILATERAL CHEST (Bilateral) POSSIBLE FAT GRAFTING BILATERAL CHEST (Bilateral) as a surgical intervention.  The patient's history has been reviewed, patient examined, no change in status, stable for surgery.  I have reviewed the patient's chart and labs.  Questions were answered to the patient's satisfaction.     Arnoldo Hooker Marc Sivertsen

## 2020-05-06 NOTE — Anesthesia Postprocedure Evaluation (Signed)
Anesthesia Post Note  Patient: Julie [name]  Procedure(s) Performed: REVISION BILATERAL BREAST RECONSTRUCTION WITH BREAST SILICONE IMPLANT EXCHANGE (Bilateral Breast) ACELLULAR DERMIS TO BILATERAL CHEST (Bilateral Breast)     Patient location during evaluation: PACU Anesthesia Type: General Level of consciousness: awake and alert Pain management: pain level controlled Vital Signs Assessment: post-procedure vital signs reviewed and stable Respiratory status: spontaneous breathing, nonlabored ventilation and respiratory function stable Cardiovascular status: blood pressure returned to baseline and stable Postop Assessment: no apparent nausea or vomiting Anesthetic complications: no    Last Vitals:  Vitals:   05/06/20 1130 05/06/20 1145  BP: 127/78 134/80  Pulse: 87 89  Resp: 15 13  Temp:    SpO2: 100% 92%    Last Pain:  Vitals:   05/06/20 1145  TempSrc:   PainSc: 0-No pain                 Lynda Rainwater

## 2020-05-06 NOTE — Anesthesia Preprocedure Evaluation (Signed)
Anesthesia Evaluation  Patient identified by MRN, date of birth, ID band Patient awake    Reviewed: Allergy & Precautions, NPO status , Patient's Chart, lab work & pertinent test results  Airway Mallampati: II  TM Distance: >3 FB Neck ROM: Full    Dental no notable dental hx. (+) Teeth Intact, Dental Advisory Given   Pulmonary neg pulmonary ROS, former smoker,    Pulmonary exam normal breath sounds clear to auscultation       Cardiovascular Exercise Tolerance: Good Normal cardiovascular exam Rhythm:Regular Rate:Normal     Neuro/Psych Depression    GI/Hepatic negative GI ROS, Neg liver ROS,   Endo/Other    Renal/GU      Musculoskeletal   Abdominal (+) + obese,   Peds  Hematology   Anesthesia Other Findings   Reproductive/Obstetrics                             Anesthesia Physical  Anesthesia Plan  ASA: II  Anesthesia Plan: General   Post-op Pain Management:    Induction: Intravenous  PONV Risk Score and Plan: 3 and Treatment may vary due to age or medical condition, Ondansetron, Dexamethasone and Midazolam  Airway Management Planned: Oral ETT  Additional Equipment:   Intra-op Plan:   Post-operative Plan: Extubation in OR  Informed Consent: I have reviewed the patients History and Physical, chart, labs and discussed the procedure including the risks, benefits and alternatives for the proposed anesthesia with the patient or authorized representative who has indicated his/her understanding and acceptance.     Dental advisory given  Plan Discussed with: CRNA  Anesthesia Plan Comments:         Anesthesia Quick Evaluation

## 2020-05-06 NOTE — Anesthesia Procedure Notes (Signed)
Procedure Name: Intubation Date/Time: 05/06/2020 7:31 AM Performed by: Signe Colt, CRNA Pre-anesthesia Checklist: Patient identified, Emergency Drugs available, Suction available and Patient being monitored Patient Re-evaluated:Patient Re-evaluated prior to induction Oxygen Delivery Method: Circle system utilized Preoxygenation: Pre-oxygenation with 100% oxygen Induction Type: IV induction Ventilation: Mask ventilation without difficulty Laryngoscope Size: Glidescope Grade View: Grade I Tube type: Oral Tube size: 7.0 mm Number of attempts: 1 Airway Equipment and Method: Stylet and Oral airway Placement Confirmation: ETT inserted through vocal cords under direct vision,  positive ETCO2 and breath sounds checked- equal and bilateral Secured at: 21 cm Tube secured with: Tape Dental Injury: Teeth and Oropharynx as per pre-operative assessment  Difficulty Due To: Difficulty was anticipated

## 2020-06-18 DIAGNOSIS — M1711 Unilateral primary osteoarthritis, right knee: Secondary | ICD-10-CM | POA: Diagnosis not present

## 2020-06-25 DIAGNOSIS — M1711 Unilateral primary osteoarthritis, right knee: Secondary | ICD-10-CM | POA: Diagnosis not present

## 2020-06-30 DIAGNOSIS — M1711 Unilateral primary osteoarthritis, right knee: Secondary | ICD-10-CM | POA: Diagnosis not present

## 2020-12-03 ENCOUNTER — Other Ambulatory Visit: Payer: Self-pay | Admitting: Hematology and Oncology

## 2021-04-07 ENCOUNTER — Telehealth: Payer: Self-pay | Admitting: Hematology and Oncology

## 2021-04-07 NOTE — Telephone Encounter (Signed)
R/s appt per 5/10 sch msg. Pt aware.  

## 2021-04-08 ENCOUNTER — Inpatient Hospital Stay: Payer: Medicare Other | Admitting: Hematology and Oncology

## 2021-04-10 ENCOUNTER — Telehealth: Payer: Self-pay | Admitting: Hematology and Oncology

## 2021-04-10 NOTE — Telephone Encounter (Signed)
R/s appt per 5/13 sch msg. Pt aware.  

## 2021-04-13 ENCOUNTER — Ambulatory Visit: Payer: Medicare Other | Admitting: Hematology and Oncology

## 2021-05-04 NOTE — Assessment & Plan Note (Signed)
04/27/2018:Bilateral mastectomies: Left mastectomy: Multifocal invasive lobular cancer grade 2, largest 2.1 cmsmaller focus 0.7 cm, margins negative, LCIS, 0/1 lymph node ER 95%, PR 100%, HER-2 negative, Ki-67 1% T2N0; Right mastectomy: [facility] Staging: T2N0 stage Ia Oncotype DXRecurrence score 19: Distant recurrence at 9 years: 6%, low risk No role of radiation since she had bilateral mastectomies  Current treatment: Letrozole 2.5 mg dailyJune 2019  Letrozole toxicities: Denies any adverse effects.  Breast cancer surveillance: No role of imaging since she had bilateral mastectomies Bone density: 08/31/2018: T score +1: Normal  Return to clinic in 1 year for follow-up

## 2021-05-04 NOTE — Progress Notes (Signed)
Patient Care Team: Deland Pretty, MD as PCP - General (Internal Medicine) Irene Limbo, MD as Consulting Physician (Plastic Surgery) Nicholas Lose, MD as Consulting Physician (Hematology and Oncology) Erroll Luna, MD as Consulting Physician (General Surgery) Eppie Gibson, MD as Attending Physician (Radiation Oncology) Gardenia Phlegm, NP as Nurse Practitioner (Hematology and Oncology)  DIAGNOSIS:    ICD-10-CM   1. Malignant neoplasm of upper-outer quadrant of left breast in female, estrogen receptor positive ([place])  C50.412    Z17.0     SUMMARY OF ONCOLOGIC HISTORY: Oncology History  Malignant neoplasm of upper-outer quadrant of left breast in female, estrogen receptor positive ([place])  02/01/2018 Initial Diagnosis   Screening detected architectural distortion in the left breast with calcifications UOQ, by ultrasound 2.2 cm at 2 o'clock position axilla negative, ultrasound biopsy revealed invasive lobular cancer grade 1 with LCIS ER 95% PR 100%, Ki-67 1%, HER-2 negative ratio 1.17, T2N0 stage Ib AJCC 8   04/27/2018 Surgery   Bilateral mastectomies: Left mastectomy: Multifocal invasive lobular cancer grade 2, largest 2.1 cm, margins negative, LCIS, 0/1 lymph node ER 95%, PR 100%, HER-2 negative, Ki-67 1% T2N0; right mastectomy: [facility]   04/27/2018 Oncotype testing   Recurrence score 19: Distant recurrence at 9 years: 6%, low risk   05/05/2018 Cancer Staging   Staging form: Breast, AJCC 8th Edition - Pathologic: Stage IA (pT2(2), pN0(sn), cM0, G2, ER+, PR+, HER2-) - Signed by Nicholas Lose, MD on 05/05/2018   04/2018 -  Anti-estrogen oral therapy   Letrozole daily     CHIEF COMPLIANT: Follow-up of left breast cancer   INTERVAL HISTORY: Julie [name] is a 70 y.o. with above-mentioned history of left breast cancer treated with bilateral mastectomies and who is currently on antiestrogen therapy with letrozole. She presents to the clinic today for follow-up.   She  complains of hot flashes and arthralgias especially in her knees.  She denies any lumps or nodules in the chest wall or axilla.    ALLERGIES:  has No Known Allergies.  MEDICATIONS:  Current Outpatient Medications  Medication Sig Dispense Refill  . buPROPion (WELLBUTRIN XL) 300 MG 24 hr tablet Take by mouth.    Marland Kitchen FLUoxetine (PROZAC) 40 MG capsule Take 1 capsule (40 mg total) by mouth daily.  3  . letrozole (FEMARA) 2.5 MG tablet TAKE 1 TABLET(2.5 MG) BY MOUTH DAILY 90 tablet 3  . rivaroxaban (XARELTO) 10 MG TABS tablet Take 10 mg by mouth daily.     No current facility-administered medications for this visit.    PHYSICAL EXAMINATION: ECOG PERFORMANCE STATUS: 1 - Symptomatic but completely ambulatory  Vitals:   05/05/21 1121  BP: 120/67  Pulse: 76  Resp: 19  Temp: 97.7 F (36.5 C)  SpO2: 98%   Filed Weights   05/05/21 1121  Weight: 184 lb 9.6 oz (83.7 kg)    BREAST: Bilateral mastectomies.  No palpable lumps or nodules. (exam performed in the presence of a chaperone)  LABORATORY DATA:  I have reviewed the data as listed CMP Latest Ref Rng & Units 04/21/2018 02/08/2018 06/09/2017  Glucose 65 - 99 mg/dL 102(H) 123 117(H)  BUN 6 - 20 mg/dL $Remove'13 14 16  'ROsLXnJ$ Creatinine 0.44 - 1.00 mg/dL 1.21(H) 1.16(H) 1.16(H)  Sodium 135 - 145 mmol/L 144 143 139  Potassium 3.5 - 5.1 mmol/L 4.7 4.5 3.6  Chloride 101 - 111 mmol/L 109 109 107  CO2 22 - 32 mmol/L $RemoveB'28 28 26  'iQceIKRg$ Calcium 8.9 - 10.3 mg/dL 9.3 9.8 9.1  Total Protein 6.5 - 8.1 g/dL 7.0 7.3 -  Total Bilirubin 0.3 - 1.2 mg/dL 0.7 0.7 -  Alkaline Phos 38 - 126 U/L 70 80 -  AST 15 - 41 U/L 16 18 -  ALT 14 - 54 U/L 20 24 -    Lab Results  Component Value Date   WBC 7.8 04/21/2018   HGB 14.1 04/21/2018   HCT 44.7 04/21/2018   MCV 93.5 04/21/2018   PLT 221 04/21/2018   NEUTROABS 4.8 04/21/2018    ASSESSMENT & PLAN:  Malignant neoplasm of upper-outer quadrant of left breast in female, estrogen receptor positive ([place]) 04/27/2018:Bilateral  mastectomies: Left mastectomy: Multifocal invasive lobular cancer grade 2, largest 2.1 cmsmaller focus 0.7 cm, margins negative, LCIS, 0/1 lymph node ER 95%, PR 100%, HER-2 negative, Ki-67 1% T2N0; Right mastectomy: [facility] Staging: T2N0 stage Ia Oncotype DXRecurrence score 19: Distant recurrence at 9 years: 6%, low risk No role of radiation since she had bilateral mastectomies  Current treatment: Letrozole 2.5 mg dailyJune 2019  Letrozole toxicities: 1.  Intermittent hot flashes: Mild 2. joint stiffness especially in her knees probably combination of arthritis and related to letrozole.  Breast cancer surveillance: No role of imaging since she had bilateral mastectomies Bone density: 08/31/2018: T score +1: Normal She now has a pet dog shitzu.  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  Return to clinic in 1 year for follow-up    No orders of the defined types were placed in this encounter.  The patient has a good understanding of the overall plan. she agrees with it. she will call with any problems that may develop before the next visit here.  Total time spent: 20 mins including face to face time and time spent for planning, charting and coordination of care  Rulon Eisenmenger, MD, MPH 05/05/2021  I, Cloyde Reams Dorshimer, am acting as scribe for Dr. Nicholas Lose.  I have reviewed the above documentation for accuracy and completeness, and I agree with the above.

## 2021-05-05 ENCOUNTER — Other Ambulatory Visit: Payer: Self-pay

## 2021-05-05 ENCOUNTER — Inpatient Hospital Stay: Payer: Medicare Other | Attending: Hematology and Oncology | Admitting: Hematology and Oncology

## 2021-05-05 DIAGNOSIS — C50412 Malignant neoplasm of upper-outer quadrant of left female breast: Secondary | ICD-10-CM | POA: Diagnosis not present

## 2021-05-05 DIAGNOSIS — Z9013 Acquired absence of bilateral breasts and nipples: Secondary | ICD-10-CM | POA: Insufficient documentation

## 2021-05-05 DIAGNOSIS — Z79899 Other long term (current) drug therapy: Secondary | ICD-10-CM | POA: Insufficient documentation

## 2021-05-05 DIAGNOSIS — Z170 Estrogen receptor positive status [ER+]: Secondary | ICD-10-CM | POA: Diagnosis not present

## 2021-06-19 DIAGNOSIS — M170 Bilateral primary osteoarthritis of knee: Secondary | ICD-10-CM | POA: Diagnosis not present

## 2021-06-23 DIAGNOSIS — Z7901 Long term (current) use of anticoagulants: Secondary | ICD-10-CM | POA: Diagnosis not present

## 2021-06-26 DIAGNOSIS — M170 Bilateral primary osteoarthritis of knee: Secondary | ICD-10-CM | POA: Diagnosis not present

## 2021-07-03 DIAGNOSIS — M170 Bilateral primary osteoarthritis of knee: Secondary | ICD-10-CM | POA: Diagnosis not present

## 2022-04-02 ENCOUNTER — Encounter (HOSPITAL_BASED_OUTPATIENT_CLINIC_OR_DEPARTMENT_OTHER): Payer: Self-pay | Admitting: Emergency Medicine

## 2022-04-02 ENCOUNTER — Other Ambulatory Visit: Payer: Self-pay

## 2022-04-02 ENCOUNTER — Emergency Department (HOSPITAL_BASED_OUTPATIENT_CLINIC_OR_DEPARTMENT_OTHER)
Admission: EM | Admit: 2022-04-02 | Discharge: 2022-04-02 | Disposition: A | Discharge status: Home / Self Care | Payer: Medicare Other | Attending: Emergency Medicine | Admitting: Emergency Medicine

## 2022-04-02 ENCOUNTER — Emergency Department (HOSPITAL_BASED_OUTPATIENT_CLINIC_OR_DEPARTMENT_OTHER): Payer: Medicare Other

## 2022-04-02 DIAGNOSIS — M1711 Unilateral primary osteoarthritis, right knee: Secondary | ICD-10-CM | POA: Insufficient documentation

## 2022-04-02 DIAGNOSIS — M25561 Pain in right knee: Secondary | ICD-10-CM | POA: Diagnosis not present

## 2022-04-02 MED ORDER — HYDROCODONE-ACETAMINOPHEN 5-325 MG PO TABS: 1.0000 | ORAL_TABLET | Freq: Four times a day (QID) | ORAL | 0 refills | Status: DC | PRN

## 2022-04-02 MED ORDER — HYDROCODONE-ACETAMINOPHEN 5-325 MG PO TABS
1.0000 | ORAL_TABLET | Freq: Once | ORAL | Status: AC
  Administered 2022-04-02: 1 via ORAL
  Filled 2022-04-02: qty 1

## 2022-04-02 NOTE — ED Triage Notes (Signed)
Pt arrives to ED with c/o knee pain. Pt reports that she has been getting injections in her right knee and was recommenced to get a knee replacement x6 months ago. Today pt reports she was unable to bair weight on the leg due to pain.  ?

## 2022-04-02 NOTE — Discharge Instructions (Signed)
Take the medications as needed for pain.  Follow-up with your orthopedic doctor for further treatment and evaluation ?

## 2022-04-02 NOTE — ED Provider Notes (Signed)
?[place] EMERGENCY DEPT ?Provider Note ? ? ?CSN: [id number] ?Arrival date & time: 04/02/22  1758 ? ?  ? ?History ? ?Chief Complaint  ?Patient presents with  ? Knee Pain  ? ? ?Julie [name] is a 71 y.o. female. ? ? ?Knee Pain ?Associated symptoms: no fever   ? ?  ?Patient presented to the ED with complaints of right knee pain.  Patient has history of osteoarthritis.  She has been seeing orthopedic doctor and has been getting injections in her knee.  Patient was told she might need of right knee replacement.  Patient states the pain in her knee today was more severe.  She has not had any fevers or chills.  No recent falls. ?Home Medications ?Prior to Admission medications   ?Medication Sig Start Date End Date Taking? Authorizing Provider  ?HYDROcodone-acetaminophen (NORCO/VICODIN) 5-325 MG tablet Take 1 tablet by mouth every 6 (six) hours as needed. 04/02/22  Yes Dorie Rank, MD  ?buPROPion (WELLBUTRIN XL) 300 MG 24 hr tablet Take by mouth.    [provider]  ?FLUoxetine (PROZAC) 40 MG capsule Take 1 capsule (40 mg total) by mouth daily. 04/08/20   Nicholas Lose, MD  ?letrozole [facility]) 2.5 MG tablet TAKE 1 TABLET(2.5 MG) BY MOUTH DAILY 12/03/20   Nicholas Lose, MD  ?rivaroxaban (XARELTO) 10 MG TABS tablet Take 10 mg by mouth daily.    [provider]  ?   ? ?Allergies    ?Patient has no known allergies.   ? ?Review of Systems   ?Review of Systems  ?Constitutional:  Negative for fever.  ? ?Physical Exam ?Updated Vital Signs ?BP (!) 169/90 (BP Location: Right Arm)   Pulse 73   Temp 98.2 ?F (36.8 ?C)   Resp 18   Ht 1.651 m ('5\' 5"'$ )   Wt 86.2 kg   SpO2 98%   BMI 31.62 kg/m?  ?Physical Exam ?Vitals and nursing note reviewed.  ?Constitutional:   ?   General: She is not in acute distress. ?   Appearance: She is well-developed.  ?HENT:  ?   Head: Normocephalic and atraumatic.  ?   Right Ear: External ear normal.  ?   Left Ear: External ear normal.  ?Eyes:  ?   General: No scleral  icterus.    ?   Right eye: No discharge.     ?   Left eye: No discharge.  ?   Conjunctiva/sclera: Conjunctivae normal.  ?Neck:  ?   Trachea: No tracheal deviation.  ?Cardiovascular:  ?   Rate and Rhythm: Normal rate.  ?Pulmonary:  ?   Effort: Pulmonary effort is normal. No respiratory distress.  ?   Breath sounds: No stridor.  ?Abdominal:  ?   General: There is no distension.  ?Musculoskeletal:     ?   General: Tenderness present. No swelling or deformity.  ?   Cervical back: Neck supple.  ?   Comments: Tenderness palpation right knee  ?Skin: ?   General: Skin is warm and dry.  ?   Findings: No rash.  ?Neurological:  ?   Mental Status: She is alert.  ?   Cranial Nerves: Cranial nerve deficit: no gross deficits.  ? ? ?ED Results / Procedures / Treatments   ?Labs ?(all labs ordered are listed, but only abnormal results are displayed) ?Labs Reviewed - No data to display ? ?EKG ?None ? ?Radiology ?DG Knee Complete 4 Views Right ? ?Result Date: 04/02/2022 ?CLINICAL DATA:  Chronic right knee pain EXAM:  RIGHT KNEE - COMPLETE 4 VIEW COMPARISON:  None Available. FINDINGS: No fracture or dislocation is seen. Degenerative changes are noted with bony spurs in medial, lateral and patellofemoral compartments. There is narrowing of joint space, more so in the lateral compartment. There is soft tissue fullness in the suprapatellar bursa suggesting small to moderate effusion. IMPRESSION: No recent fracture or dislocation is seen in the right knee. Degenerative changes are noted with bony spurs. Small to moderate effusion is seen in the suprapatellar bursa. Electronically Signed   By: Elmer Picker M.D.   On: 04/02/2022 18:40   ? ?Procedures ?Procedures  ? ? ?Medications Ordered in ED ?Medications  ?HYDROcodone-acetaminophen (NORCO/VICODIN) 5-325 MG per tablet 1 tablet (has no administration in time range)  ? ? ?ED Course/ Medical Decision Making/ A&P ?  ?                        ?Medical Decision Making ?Amount and/or Complexity  of Data Reviewed ?Radiology: ordered. ? ?Risk ?Prescription drug management. ? ? ?No signs of any erythema.  No warmth on exam.  Doubt any infection.  X-rays do show osteoarthritis.  Recommend using walker to keep off of her knee.  I will give her a short course of hydrocodone to help with pain.  Outpatient follow-up with her orthopedic doctor. ? ? ? ? ? ? ? ?Final Clinical Impression(s) / ED Diagnoses ?Final diagnoses:  ?Osteoarthritis of right knee, unspecified osteoarthritis type  ? ? ?Rx / DC Orders ?ED Discharge Orders   ? ?      Ordered  ?  Walker rolling       ? 04/02/22 2000  ?  HYDROcodone-acetaminophen (NORCO/VICODIN) 5-325 MG tablet  Every 6 hours PRN       ? 04/02/22 2000  ? ?  ?  ? ?  ? ? ?  ?Dorie Rank, MD ?04/02/22 2002 ? ?

## 2022-04-12 ENCOUNTER — Telehealth: Payer: Self-pay | Admitting: Hematology and Oncology

## 2022-04-12 NOTE — Telephone Encounter (Signed)
Rescheduled appointment per provider PAL. Patient is aware of the changes made to her upcoming appointment. 

## 2022-04-16 DIAGNOSIS — M170 Bilateral primary osteoarthritis of knee: Secondary | ICD-10-CM | POA: Diagnosis not present

## 2022-05-05 ENCOUNTER — Ambulatory Visit: Payer: Medicare Other | Admitting: Hematology and Oncology

## 2022-05-08 ENCOUNTER — Other Ambulatory Visit: Payer: Self-pay | Admitting: Hematology and Oncology

## 2022-05-12 DIAGNOSIS — Z0000 Encounter for general adult medical examination without abnormal findings: Secondary | ICD-10-CM | POA: Diagnosis not present

## 2022-05-12 DIAGNOSIS — R7303 Prediabetes: Secondary | ICD-10-CM | POA: Diagnosis not present

## 2022-05-12 DIAGNOSIS — E559 Vitamin D deficiency, unspecified: Secondary | ICD-10-CM | POA: Diagnosis not present

## 2022-05-12 DIAGNOSIS — E7800 Pure hypercholesterolemia, unspecified: Secondary | ICD-10-CM | POA: Diagnosis not present

## 2022-05-14 NOTE — Progress Notes (Signed)
Patient Care Team: Deland Pretty, MD as PCP - General (Internal Medicine) Irene Limbo, MD as Consulting Physician (Plastic Surgery) Nicholas Lose, MD as Consulting Physician (Hematology and Oncology) Erroll Luna, MD as Consulting Physician (General Surgery) Eppie Gibson, MD as Attending Physician (Radiation Oncology) Gardenia Phlegm, NP as Nurse Practitioner (Hematology and Oncology)  DIAGNOSIS:  Encounter Diagnosis  Name Primary?   Malignant neoplasm of upper-outer quadrant of left breast in female, estrogen receptor positive ([place])     SUMMARY OF ONCOLOGIC HISTORY: Oncology History  Malignant neoplasm of upper-outer quadrant of left breast in female, estrogen receptor positive ([place])  02/01/2018 Initial Diagnosis   Screening detected architectural distortion in the left breast with calcifications UOQ, by ultrasound 2.2 cm at 2 o'clock position axilla negative, ultrasound biopsy revealed invasive lobular cancer grade 1 with LCIS ER 95% PR 100%, Ki-67 1%, HER-2 negative ratio 1.17, T2N0 stage Ib AJCC 8   04/27/2018 Surgery   Bilateral mastectomies: Left mastectomy: Multifocal invasive lobular cancer grade 2, largest 2.1 cm, margins negative, LCIS, 0/1 lymph node ER 95%, PR 100%, HER-2 negative, Ki-67 1% T2N0; right maste[facility]: [facility]   04/27/2018 Oncotype testing   Recurrence score 19: Distant recurrence at 9 years: 6%, low risk   05/05/2018 Cancer Staging   Staging form: Breast, AJCC 8th Edition - Pathologic: Stage IA (pT2(2), pN0(sn), cM0, G2, ER+, PR+, HER2-) - Signed by Nicholas Lose, MD on 05/05/2018   04/2018 -  Anti-estrogen oral therapy   Letrozole daily     CHIEF COMPLIANT: Follow-up of left breast cancer on Letrozole on Xarelto  INTERVAL HISTORY: Julie [name] is a 71 y.o. with above-mentioned history of left breast cancer treated with bilateral mastectomies and who is currently on antiestrogen therapy with letrozole. She presents to the clinic today  for annual follow-up. She states that she is tolerating the letrozole. Denies hot flashes and joint stiffness. States that's she had blood clots 6 years ago.  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   ALLERGIES:  has No Known Allergies.  MEDICATIONS:  Current Outpatient Medications  Medication Sig Dispense Refill   buPROPion (WELLBUTRIN XL) 300 MG 24 hr tablet Take by mouth.     FLUoxetine (PROZAC) 40 MG capsule Take 1 capsule (40 mg total) by mouth daily.  3   letrozole (FEMARA) 2.5 MG tablet TAKE 1 TABLET(2.5 MG) BY MOUTH DAILY 90 tablet 3   rivaroxaban (XARELTO) 10 MG TABS tablet Take 10 mg by mouth daily.     No current facility-administered medications for this visit.    PHYSICAL EXAMINATION: ECOG PERFORMANCE STATUS: 1 - Symptomatic but completely ambulatory  Vitals:   05/28/22 1117  BP: (!) 141/74  Pulse: 64  Resp: 18  Temp: 97.7 F (36.5 C)  SpO2: 100%   Filed Weights   05/28/22 1117  Weight: 192 lb 11.2 oz (87.4 kg)    BREAST: No palpable masses or nodules in either right or left breasts. No palpable axillary supraclavicular or infraclavicular adenopathy no breast tenderness or nipple discharge. (exam performed in the presence of a chaperone)  LABORATORY DATA:  I have reviewed the data as listed    Latest Ref Rng & Units 04/21/2018   10:22 AM 02/08/2018    8:34 AM 06/09/2017    1:39 PM  CMP  Glucose 65 - 99 mg/dL 102  123  117   BUN 6 - 20  mg/dL '13  14  16   '$ Creatinine 0.44 - 1.00 mg/dL 1.21  1.16  1.16   Sodium 135 - 145 mmol/L 144  143  139   Potassium 3.5 - 5.1 mmol/L 4.7  4.5  3.6   Chloride 101 - 111 mmol/L 109  109  107   CO2 22 - 32 mmol/L $RemoveB'28  28  26   'fPPnCnGM$ Calcium 8.9 - 10.3 mg/dL 9.3  9.8  9.1   Total Protein 6.5 - 8.1 g/dL 7.0  7.3    Total Bilirubin 0.3 - 1.2 mg/dL 0.7  0.7    Alkaline Phos 38 - 126 U/L 70  80    AST 15 - 41 U/L 16  18     ALT 14 - 54 U/L 20  24      Lab Results  Component Value Date   WBC 7.8 04/21/2018   HGB 14.1 04/21/2018   HCT 44.7 04/21/2018   MCV 93.5 04/21/2018   PLT 221 04/21/2018   NEUTROABS 4.8 04/21/2018    ASSESSMENT & PLAN:  Malignant neoplasm of upper-outer quadrant of left breast in female, estrogen receptor positive ([place]) 04/27/2018:Bilateral mastectomies: Left mastectomy: Multifocal invasive lobular cancer grade 2, largest 2.1 cm smaller focus 0.7 cm, margins negative, LCIS, 0/1 lymph node ER 95%, PR 100%, HER-2 negative, Ki-67 1% T2N0;  Right mastectomy: [facility] Staging: T2N0 stage Ia Oncotype DX Recurrence score 19: Distant recurrence at 9 years: 6%, low risk No role of radiation since she had bilateral mastectomies   Current treatment: Letrozole 2.5 mg daily June 2019   Letrozole toxicities: 1.  Intermittent hot flashes: Mild 2. joint stiffness especially in her knees probably combination of arthritis and related to letrozole.   Breast cancer surveillance:  No role of imaging since she had bilateral mastectomies Breast Exam: 05/28/22: Benign Bone density: 08/31/2018: T score +1: Normal She now has a pet dog shitzu.   Her mother passed away last year and she no longer goes to [place] anymore.   Return to clinic in 1 year for follow-up    No orders of the defined types were placed in this encounter.  The patient has a good understanding of the overall plan. she agrees with it. she will call with any problems that may develop before the next visit here. Total time spent: 30 mins including face to face time and time spent for planning, charting and co-ordination of care   Harriette Ohara, MD 05/28/22    I Gardiner Coins am scribing for Dr. Lindi Adie  I have reviewed the above documentation for accuracy and completeness, and I agree with the above.

## 2022-05-19 ENCOUNTER — Other Ambulatory Visit: Payer: Self-pay | Admitting: Registered Nurse

## 2022-05-19 DIAGNOSIS — I809 Phlebitis and thrombophlebitis of unspecified site: Secondary | ICD-10-CM | POA: Diagnosis not present

## 2022-05-19 DIAGNOSIS — E1165 Type 2 diabetes mellitus with hyperglycemia: Secondary | ICD-10-CM | POA: Diagnosis not present

## 2022-05-19 DIAGNOSIS — R2231 Localized swelling, mass and lump, right upper limb: Secondary | ICD-10-CM | POA: Diagnosis not present

## 2022-05-19 DIAGNOSIS — Z0000 Encounter for general adult medical examination without abnormal findings: Secondary | ICD-10-CM | POA: Diagnosis not present

## 2022-05-19 DIAGNOSIS — Z7901 Long term (current) use of anticoagulants: Secondary | ICD-10-CM | POA: Diagnosis not present

## 2022-05-19 DIAGNOSIS — F17200 Nicotine dependence, unspecified, uncomplicated: Secondary | ICD-10-CM | POA: Diagnosis not present

## 2022-05-19 DIAGNOSIS — E7800 Pure hypercholesterolemia, unspecified: Secondary | ICD-10-CM | POA: Diagnosis not present

## 2022-05-19 DIAGNOSIS — Z1212 Encounter for screening for malignant neoplasm of rectum: Secondary | ICD-10-CM | POA: Diagnosis not present

## 2022-05-19 DIAGNOSIS — Z853 Personal history of malignant neoplasm of breast: Secondary | ICD-10-CM | POA: Diagnosis not present

## 2022-05-19 DIAGNOSIS — E559 Vitamin D deficiency, unspecified: Secondary | ICD-10-CM | POA: Diagnosis not present

## 2022-05-28 ENCOUNTER — Inpatient Hospital Stay: Payer: Medicare Other | Attending: Hematology and Oncology | Admitting: Hematology and Oncology

## 2022-05-28 ENCOUNTER — Other Ambulatory Visit: Payer: Self-pay

## 2022-05-28 DIAGNOSIS — Z9013 Acquired absence of bilateral breasts and nipples: Secondary | ICD-10-CM | POA: Diagnosis not present

## 2022-05-28 DIAGNOSIS — Z79811 Long term (current) use of aromatase inhibitors: Secondary | ICD-10-CM | POA: Insufficient documentation

## 2022-05-28 DIAGNOSIS — C50412 Malignant neoplasm of upper-outer quadrant of left female breast: Secondary | ICD-10-CM | POA: Insufficient documentation

## 2022-05-28 DIAGNOSIS — M170 Bilateral primary osteoarthritis of knee: Secondary | ICD-10-CM | POA: Diagnosis not present

## 2022-05-28 DIAGNOSIS — Z170 Estrogen receptor positive status [ER+]: Secondary | ICD-10-CM | POA: Insufficient documentation

## 2022-05-28 NOTE — Assessment & Plan Note (Signed)
04/27/2018:Bilateral mastectomies: Left mastectomy: Multifocal invasive lobular cancer grade 2, largest 2.1 cmsmaller focus 0.7 cm, margins negative, LCIS, 0/1 lymph node ER 95%, PR 100%, HER-2 negative, Ki-67 1% T2N0; Right mastectomy: AL[facility] Staging: T2N0 stage Ia Oncotype DXRecurrence score 19: Distant recurrence at 9 years: 6%, low risk No role of radiation since she had bilateral mastectomies  Current treatment: Letrozole 2.5 mg dailyJune 2019  Letrozole toxicities: 1.  Intermittent hot flashes: Mild 2. joint stiffness especially in her knees probably combination of arthritis and related to letrozole.  Breast cancer surveillance:  No role of imaging since she had bilateral mastectomies Breast Exam: 05/28/22: Benign Bone density: 08/31/2018: T score +1: Normal She now has a pet dog shitzu.  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  Return to clinic in 1 year for follow-up

## 2022-06-07 DIAGNOSIS — H524 Presbyopia: Secondary | ICD-10-CM | POA: Diagnosis not present

## 2022-06-09 DIAGNOSIS — E7800 Pure hypercholesterolemia, unspecified: Secondary | ICD-10-CM | POA: Diagnosis not present

## 2022-06-09 DIAGNOSIS — E1165 Type 2 diabetes mellitus with hyperglycemia: Secondary | ICD-10-CM | POA: Diagnosis not present

## 2022-06-10 DIAGNOSIS — R2231 Localized swelling, mass and lump, right upper limb: Secondary | ICD-10-CM | POA: Diagnosis not present

## 2022-07-20 ENCOUNTER — Ambulatory Visit
Admission: RE | Admit: 2022-07-20 | Discharge: 2022-07-20 | Disposition: A | Discharge status: Home / Self Care | Payer: Medicare Other | Source: Ambulatory Visit | Attending: Registered Nurse | Admitting: Registered Nurse

## 2022-07-20 DIAGNOSIS — F17200 Nicotine dependence, unspecified, uncomplicated: Secondary | ICD-10-CM

## 2022-07-20 DIAGNOSIS — I700 Atherosclerosis of aorta: Secondary | ICD-10-CM | POA: Diagnosis not present

## 2022-08-20 DIAGNOSIS — E7800 Pure hypercholesterolemia, unspecified: Secondary | ICD-10-CM | POA: Diagnosis not present

## 2022-08-20 DIAGNOSIS — E1165 Type 2 diabetes mellitus with hyperglycemia: Secondary | ICD-10-CM | POA: Diagnosis not present

## 2022-08-20 DIAGNOSIS — E559 Vitamin D deficiency, unspecified: Secondary | ICD-10-CM | POA: Diagnosis not present

## 2022-08-20 DIAGNOSIS — I82409 Acute embolism and thrombosis of unspecified deep veins of unspecified lower extremity: Secondary | ICD-10-CM | POA: Diagnosis not present

## 2022-08-20 DIAGNOSIS — Z7901 Long term (current) use of anticoagulants: Secondary | ICD-10-CM | POA: Diagnosis not present

## 2022-10-11 DIAGNOSIS — M170 Bilateral primary osteoarthritis of knee: Secondary | ICD-10-CM | POA: Diagnosis not present

## 2022-12-20 DIAGNOSIS — E559 Vitamin D deficiency, unspecified: Secondary | ICD-10-CM | POA: Diagnosis not present

## 2022-12-20 DIAGNOSIS — E1165 Type 2 diabetes mellitus with hyperglycemia: Secondary | ICD-10-CM | POA: Diagnosis not present

## 2022-12-20 DIAGNOSIS — E7800 Pure hypercholesterolemia, unspecified: Secondary | ICD-10-CM | POA: Diagnosis not present

## 2022-12-27 ENCOUNTER — Encounter: Payer: Self-pay | Admitting: Hematology and Oncology

## 2022-12-27 DIAGNOSIS — E1165 Type 2 diabetes mellitus with hyperglycemia: Secondary | ICD-10-CM | POA: Diagnosis not present

## 2022-12-27 DIAGNOSIS — Z7901 Long term (current) use of anticoagulants: Secondary | ICD-10-CM | POA: Diagnosis not present

## 2022-12-27 DIAGNOSIS — I82409 Acute embolism and thrombosis of unspecified deep veins of unspecified lower extremity: Secondary | ICD-10-CM | POA: Diagnosis not present

## 2022-12-27 DIAGNOSIS — E7800 Pure hypercholesterolemia, unspecified: Secondary | ICD-10-CM | POA: Diagnosis not present

## 2022-12-27 DIAGNOSIS — E559 Vitamin D deficiency, unspecified: Secondary | ICD-10-CM | POA: Diagnosis not present

## 2023-03-01 ENCOUNTER — Ambulatory Visit: Payer: Medicare Other | Admitting: Plastic Surgery

## 2023-03-01 ENCOUNTER — Encounter: Payer: Self-pay | Admitting: Plastic Surgery

## 2023-03-01 VITALS — BP 134/85 | HR 85 | Ht 65.75 in | Wt 192.6 lb

## 2023-03-01 DIAGNOSIS — Z170 Estrogen receptor positive status [ER+]: Secondary | ICD-10-CM

## 2023-03-01 DIAGNOSIS — N644 Mastodynia: Secondary | ICD-10-CM

## 2023-03-01 DIAGNOSIS — C50412 Malignant neoplasm of upper-outer quadrant of left female breast: Secondary | ICD-10-CM | POA: Diagnosis not present

## 2023-03-01 DIAGNOSIS — Z9013 Acquired absence of bilateral breasts and nipples: Secondary | ICD-10-CM | POA: Diagnosis not present

## 2023-03-01 NOTE — Progress Notes (Addendum)
Patient ID: Julie [name], female    DOB: [birth year], 72 y.o.   MRN: [id number]   Chief Complaint  Patient presents with   Advice Only   Breast Problem    The patient is a 72 year old female here for evaluation of her breasts.  She underwent bilateral mastectomies in 2019 by Dr. Luisa Hart.  This was for a left breast cancer in the upper outer quadrant which was lobular carcinoma in situ and invasive lobular carcinoma.  It was estrogen and progesterone positive and HER2 negative.  She had prepec implants placed with ADM wrap.  She had 700 cc silicone implants placed in September 2019.  Patient then had some issues and in June 2021 had the implants replaced with silicone 750 cc implants with more AlloDerm.  The patient expresses being unhappy with the results.  Overall they look good.  There is a tremendous amount of rippling but you can only see that from the superior to inferior view.  The main issue is pain of the right breast and discomfort.  Patient feels like she can never get comfortable and that the implant has too much movement on that side.  She is 5 feet 5 inches tall weighs 192 pounds.  She is in good health at this time.     Review of Systems  Constitutional: Negative.   Eyes: Negative.   Respiratory:  Positive for chest tightness. Negative for shortness of breath.   Cardiovascular: Negative.   Gastrointestinal: Negative.   Endocrine: Negative.   Genitourinary: Negative.   Musculoskeletal: Negative.     Past Medical History:  Diagnosis Date   Depression    DVT (deep venous thrombosis)    Torn meniscus     Past Surgical History:  Procedure Laterality Date   APPLICATION OF A-CELL OF CHEST/ABDOMEN Bilateral 05/06/2020   Procedure: ACELLULAR DERMIS TO BILATERAL CHEST;  Surgeon: Glenna Fellows, MD;  Location: [facility];  Service: Plastics;  Laterality: Bilateral;   BREAST IMPLANT EXCHANGE Bilateral 05/06/2020   Procedure: REVISION BILATERAL BREAST  RECONSTRUCTION WITH BREAST SILICONE IMPLANT EXCHANGE;  Surgeon: Glenna Fellows, MD;  Location: [facility];  Service: Plastics;  Laterality: Bilateral;   BREAST RECONSTRUCTION WITH PLACEMENT OF TISSUE EXPANDER AND FLEX HD (ACELLULAR HYDRATED DERMIS) Bilateral 04/27/2018   Procedure: Bilateral BREAST RECONSTRUCTION WITH PLACEMENT OF TISSUE EXPANDER AND FLEX HD (ACELLULAR HYDRATED DERMIS);  Surgeon: Glenna Fellows, MD;  Location: [facility];  Service: Plastics;  Laterality: Bilateral;   LESION REMOVAL Right 08/11/2018   Procedure: EXCISION OF SKIN LESION FROM LOWER ABDOMEN;  Surgeon: Glenna Fellows, MD;  Location: [facility];  Service: Plastics;  Laterality: Right;   LIPOSUCTION WITH LIPOFILLING Bilateral 08/11/2018   Procedure: LIPOSUCTION WITH LIPOFILLING FROM ABDOMEN TO CHEST;  Surgeon: Glenna Fellows, MD;  Location: [facility];  Service: Plastics;  Laterality: Bilateral;   MASTECTOMY W/ SENTINEL NODE BIOPSY Bilateral 04/27/2018   Procedure: BILATERAL NIPPLE SPARING MASTECTOMIES WITH LEFT SENTINAL LYMPH NODE BIOPSY;  Surgeon: Harriette Bouillon, MD;  Location: [facility];  Service: General;  Laterality: Bilateral;   REMOVAL OF TISSUE EXPANDER AND PLACEMENT OF IMPLANT Bilateral 08/11/2018   Procedure: REMOVAL OF BILATERAL TISSUE EXPANDERS AND PLACEMENT OF SILICONE  IMPLANTS;  Surgeon: Glenna Fellows, MD;  Location: [facility];  Service: Plastics;  Laterality: Bilateral;   TONSILLECTOMY     TUBAL LIGATION        Current Outpatient Medications:    buPROPion [facility]  XL) 300 MG 24 hr tablet, Take by mouth., Disp: , Rfl:    FLUoxetine (PROZAC) 40 MG capsule, Take 1 capsule (40 mg total) by mouth daily., Disp: , Rfl: 3   letrozole (FEMARA) 2.5 MG tablet, TAKE 1 TABLET(2.5 MG) BY MOUTH DAILY, Disp: 90 tablet, Rfl: 3   rivaroxaban (XARELTO) 10 MG TABS tablet, Take 10 mg by mouth daily., Disp: , Rfl:     Objective:   Vitals:   03/01/23 1456  BP: 134/85  Pulse: 85  SpO2: 92%    Physical Exam Vitals and nursing note reviewed.  Constitutional:      Appearance: Normal appearance.  HENT:     Head: Atraumatic.  Cardiovascular:     Rate and Rhythm: Normal rate.  Pulmonary:     Effort: Pulmonary effort is normal.  Abdominal:     Palpations: Abdomen is soft.  Skin:    General: Skin is warm.     Capillary Refill: Capillary refill takes less than 2 seconds.     Coloration: Skin is not jaundiced.  Neurological:     Mental Status: She is alert and oriented to person, place, and time.  Psychiatric:        Mood and Affect: Mood normal.        Behavior: Behavior normal.        Thought Content: Thought content normal.        Judgment: Judgment normal.     Assessment & Plan:  Malignant neoplasm of upper-outer quadrant of left breast in female, estrogen receptor positive  The patient could have fat grafting to help with the rippling.  She could also have nipple areola tattooing which would look really good.  The main issue is the concern of the pain.  With that in mind I am going to reach out to Dr. Lindi Adie to see if an ultrasound or MRI would be warranted at this time.  Once we have that and know everything is okay we can talk about options.  Pictures were obtained of the patient and placed in the chart with the patient's or guardian's permission.  03/02/23 - message received from Dr. Lindi Adie for U/S or MRI.  I will order MRI now.  [place], DO

## 2023-03-02 NOTE — Addendum Note (Signed)
Addended by: Wallace Going on: 03/02/2023 02:07 PM   Modules accepted: Orders

## 2023-03-04 DIAGNOSIS — M170 Bilateral primary osteoarthritis of knee: Secondary | ICD-10-CM | POA: Diagnosis not present

## 2023-03-25 ENCOUNTER — Ambulatory Visit
Admission: RE | Admit: 2023-03-25 | Discharge: 2023-03-25 | Disposition: A | Discharge status: Home / Self Care | Payer: Medicare Other | Source: Ambulatory Visit | Attending: Plastic Surgery | Admitting: Plastic Surgery

## 2023-03-25 DIAGNOSIS — C50412 Malignant neoplasm of upper-outer quadrant of left female breast: Secondary | ICD-10-CM | POA: Diagnosis not present

## 2023-03-25 DIAGNOSIS — Z170 Estrogen receptor positive status [ER+]: Secondary | ICD-10-CM

## 2023-03-25 MED ORDER — GADOPICLENOL 0.5 MMOL/ML IV SOLN
9.0000 mL | Freq: Once | INTRAVENOUS | Status: AC | PRN
  Administered 2023-03-25: 9 mL via INTRAVENOUS

## 2023-03-29 ENCOUNTER — Telehealth: Payer: Self-pay | Admitting: *Deleted

## 2023-03-29 NOTE — Telephone Encounter (Signed)
-----   Message from Peggye Form, DO sent at 03/25/2023 12:43 PM EDT ----- Please let pt know ----- Message ----- From: Interface, Rad Results In Sent: 03/25/2023  11:35 AM EDT To: Alena Bills [place], DO

## 2023-03-29 NOTE — Telephone Encounter (Signed)
Attempted to reach Ms. Kulig to review breast MRi results per Dr. Ulice Bold. NA/LVM to call the office.

## 2023-03-31 NOTE — Telephone Encounter (Signed)
2nd attempt to reach Ms. Julie [name]. She did call back in response to my first call and left a voicemail

## 2023-03-31 NOTE — Telephone Encounter (Signed)
Results of breast MRI from 03/25/23 reviewed with Julie [name]. She states she is still having pain and feels like the implant is moving if she doesn't have on a sports bra. States she has to sleep in the sports bra and would like to know if something can be done surgically. I explained Dr. Ulice Bold is in surgery today but I would check with her tomorrow about setting up a f/u appointment.

## 2023-04-02 ENCOUNTER — Ambulatory Visit (INDEPENDENT_AMBULATORY_CARE_PROVIDER_SITE_OTHER): Payer: Medicare Other | Admitting: Plastic Surgery

## 2023-04-02 DIAGNOSIS — Z170 Estrogen receptor positive status [ER+]: Secondary | ICD-10-CM

## 2023-04-02 DIAGNOSIS — C50412 Malignant neoplasm of upper-outer quadrant of left female breast: Secondary | ICD-10-CM | POA: Diagnosis not present

## 2023-04-02 NOTE — Progress Notes (Signed)
   Subjective:    Patient ID: Julie [name], female    DOB: Jul 09, 1951, 72 y.o.   MRN: [id number]  The patient is a 72 year old female joining me by phone.  She had bilateral mastectomies by Dr. Luisa Hart in 2019.  She then had reconstruction with implants placed over her muscle and wrapped with ADM.  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  It was estrogen and progesterone positive and HER2 negative.  The first reconstruction she had 700 cc silicone implants placed.  She had some issues a few years later and had the implants removed with silicone 750 cc implants placed and more AlloDerm.  The patient is here today unhappy with her results mostly on the right side.  She is wondering if helping her mom has somehow disrupted the implant.  She had an MRI and no areas of concern were noted.  The patient says that she feels like the implant is moving on her and does not like the way that it feels.  She also does not like the rippling which is really obvious from the superior to inferior view.  She feels like she cannot get comfortable and always has to wear a bra.  Discussed options again.     Review of Systems  Constitutional: Negative.   Eyes: Negative.   Respiratory: Negative.    Cardiovascular: Negative.   Gastrointestinal: Negative.   Endocrine: Negative.        Objective:   Physical Exam       Assessment & Plan:     ICD-10-CM   1. Malignant neoplasm of upper-outer quadrant of left breast in female, estrogen receptor positive (HCC)  C50.412    Z17.0       I connected with  Julie [name] on 04/02/23 by phone and verified that I am speaking with the correct person using two identifiers.  The patient was at home and I was at the office.  We spent 10 minutes in discussion.  I encouraged the patient to return to her original surgeon.  The patient has decided that she is going to go back and  see her original Careers adviser.  I remain available if needed.   I discussed the limitations of evaluation and management by telemedicine. The patient expressed understanding and agreed to proceed.  I have encouraged the patient to return to her original surgeon.  We talked about fat grafting to help with the rippling.  She may need some capsular work done to help with the movement.

## 2023-05-16 ENCOUNTER — Other Ambulatory Visit: Payer: Self-pay | Admitting: Hematology and Oncology

## 2023-05-28 NOTE — Progress Notes (Signed)
Patient Care Team: Merri Brunette, MD as PCP - General (Internal Medicine) Glenna Fellows, MD as Consulting Physician (Plastic Surgery) Serena Croissant, MD as Consulting Physician (Hematology and Oncology) Harriette Bouillon, MD as Consulting Physician (General Surgery) Lonie Peak, MD as Attending Physician (Radiation Oncology) Loa Socks, NP as Nurse Practitioner (Hematology and Oncology)  DIAGNOSIS: No diagnosis found.  SUMMARY OF ONCOLOGIC HISTORY: Oncology History  Malignant neoplasm of upper-outer quadrant of left breast in female, estrogen receptor positive (HCC)  02/01/2018 Initial Diagnosis   Screening detected architectural distortion in the left breast with calcifications UOQ, by ultrasound 2.2 cm at 2 o'clock position axilla negative, ultrasound biopsy revealed invasive lobular cancer grade 1 with LCIS ER 95% PR 100%, Ki-67 1%, HER-2 negative ratio 1.17, T2N0 stage Ib AJCC 8   04/27/2018 Surgery   Bilateral mastectomies: Left mastectomy: Multifocal invasive lobular cancer grade 2, largest 2.1 cm, margins negative, LCIS, 0/1 lymph node ER 95%, PR 100%, HER-2 negative, Ki-67 1% T2N0; [facility] maste[facility]: [facility]   04/27/2018 Oncotype testing   Recurrence score 19: Distant recurrence at 9 years: 6%, low risk   05/05/2018 Cancer Staging   Staging form: Breast, AJCC 8th Edition - Pathologic: Stage IA (pT2(2), pN0(sn), cM0, G2, ER+, PR+, HER2-) - Signed by Serena Croissant, MD on 05/05/2018   04/2018 -  Anti-estrogen oral therapy   Letrozole daily     CHIEF COMPLIANT: Follow-up of left breast cancer on Letrozole on Xarelto    INTERVAL HISTORY: Julie [name] is a 72 y.o. with above-mentioned history of left breast cancer treated with bilateral mastectomies and who is currently on antiestrogen therapy with letrozole. She presents to the clinic today for annual follow-up.    ALLERGIES:  has No Known Allergies.  MEDICATIONS:  Current Outpatient Medications  Medication  Sig Dispense Refill   buPROPion (WELLBUTRIN XL) 300 MG 24 hr tablet Take by mouth.     FLUoxetine (PROZAC) 40 MG capsule Take 1 capsule (40 mg total) by mouth daily.  3   letrozole (FEMARA) 2.5 MG tablet TAKE 1 TABLET(2.5 MG) BY MOUTH DAILY 90 tablet 3   rivaroxaban (XARELTO) 10 MG TABS tablet Take 10 mg by mouth daily.     No current facility-administered medications for this visit.    PHYSICAL EXAMINATION: ECOG PERFORMANCE STATUS: {CHL ONC ECOG [phone]}  There were no vitals filed for this visit. There were no vitals filed for this visit.  BREAST:*** No palpable masses or nodules in either right or left breasts. No palpable axillary supraclavicular or infraclavicular adenopathy no breast tenderness or nipple discharge. (exam performed in the presence of a chaperone)  LABORATORY DATA:  I have reviewed the data as listed    Latest Ref Rng & Units 04/21/2018   10:22 AM 02/08/2018    8:34 AM 06/09/2017    1:39 PM  CMP  Glucose 65 - 99 mg/dL 161  096  045   BUN 6 - 20 mg/dL 13  14  16    Creatinine 0.44 - 1.00 mg/dL 4.09  8.11  9.14   Sodium 135 - 145 mmol/L 144  143  139   Potassium 3.5 - 5.1 mmol/L 4.7  4.5  3.6   Chloride 101 - 111 mmol/L 109  109  107   CO2 22 - 32 mmol/L 28  28  26    Calcium 8.9 - 10.3 mg/dL 9.3  9.8  9.1   Total Protein 6.5 - 8.1 g/dL 7.0  7.3    Total Bilirubin 0.3 -  1.2 mg/dL 0.7  0.7    Alkaline Phos 38 - 126 U/L 70  80    AST 15 - 41 U/L 16  18    ALT 14 - 54 U/L 20  24      Lab Results  Component Value Date   WBC 7.8 04/21/2018   HGB 14.1 04/21/2018   HCT 44.7 04/21/2018   MCV 93.5 04/21/2018   PLT 221 04/21/2018   NEUTROABS 4.8 04/21/2018    ASSESSMENT & PLAN:  No problem-specific Assessment & Plan notes found for this encounter.    No orders of the defined types were placed in this encounter.  The patient has a good understanding of the overall plan. she agrees with it. she will call with any problems that may develop before the  next visit here. Total time spent: 30 mins including face to face time and time spent for planning, charting and co-ordination of care   Sherlyn Lick, CMA 05/28/23    I Janan Ridge am acting as a Neurosurgeon for [employer]  ***

## 2023-05-30 ENCOUNTER — Inpatient Hospital Stay: Payer: Medicare Other | Attending: Hematology and Oncology | Admitting: Hematology and Oncology

## 2023-05-30 ENCOUNTER — Other Ambulatory Visit: Payer: Self-pay

## 2023-05-30 VITALS — BP 143/76 | HR 91 | Temp 97.9°F | Resp 18 | Ht 65.75 in | Wt 188.6 lb

## 2023-05-30 DIAGNOSIS — Z9013 Acquired absence of bilateral breasts and nipples: Secondary | ICD-10-CM | POA: Diagnosis not present

## 2023-05-30 DIAGNOSIS — C50412 Malignant neoplasm of upper-outer quadrant of left female breast: Secondary | ICD-10-CM | POA: Insufficient documentation

## 2023-05-30 DIAGNOSIS — Z170 Estrogen receptor positive status [ER+]: Secondary | ICD-10-CM | POA: Diagnosis not present

## 2023-05-30 DIAGNOSIS — Z79811 Long term (current) use of aromatase inhibitors: Secondary | ICD-10-CM | POA: Diagnosis not present

## 2023-05-30 MED ORDER — LETROZOLE 2.5 MG PO TABS: 2.5000 mg | ORAL_TABLET | Freq: Every day | ORAL | 3 refills | Status: DC

## 2023-05-30 NOTE — Assessment & Plan Note (Addendum)
04/27/2018:Bilateral mastectomies: Left mastectomy: Multifocal invasive lobular cancer grade 2, largest 2.1 cm smaller focus 0.7 cm, margins negative, LCIS, 0/1 lymph node ER 95%, PR 100%, HER-2 negative, Ki-67 1% T2N0;  Right mastectomy: [facility] Staging: T2N0 stage Ia Oncotype DX Recurrence score 19: Distant recurrence at 9 years: 6%, low risk No role of radiation since she had bilateral mastectomies   Current treatment: Letrozole 2.5 mg daily June 2019 (plans for 7 years)   Letrozole toxicities: 1.  Intermittent hot flashes: Mild 2. joint stiffness especially in her knees probably combination of arthritis and related to letrozole.   Breast cancer surveillance:  No role of imaging since she had bilateral mastectomies Breast Exam: 05/30/2023: Benign Bone density: 08/31/2018: T score +1: Normal.  She did not want to repeat another bone density at this time. She has a Development worker, international aid shitzu.   Her mother passed away and she no longer goes to [place] anymore.   Return to clinic in 1 year for follow-up

## 2023-06-01 DIAGNOSIS — R7303 Prediabetes: Secondary | ICD-10-CM | POA: Diagnosis not present

## 2023-06-01 DIAGNOSIS — E7800 Pure hypercholesterolemia, unspecified: Secondary | ICD-10-CM | POA: Diagnosis not present

## 2023-06-01 DIAGNOSIS — E559 Vitamin D deficiency, unspecified: Secondary | ICD-10-CM | POA: Diagnosis not present

## 2023-06-01 DIAGNOSIS — Z7901 Long term (current) use of anticoagulants: Secondary | ICD-10-CM | POA: Diagnosis not present

## 2023-06-08 ENCOUNTER — Other Ambulatory Visit: Payer: Self-pay | Admitting: Registered Nurse

## 2023-06-08 DIAGNOSIS — I2510 Atherosclerotic heart disease of native coronary artery without angina pectoris: Secondary | ICD-10-CM | POA: Diagnosis not present

## 2023-06-08 DIAGNOSIS — E7800 Pure hypercholesterolemia, unspecified: Secondary | ICD-10-CM | POA: Diagnosis not present

## 2023-06-08 DIAGNOSIS — D229 Melanocytic nevi, unspecified: Secondary | ICD-10-CM | POA: Diagnosis not present

## 2023-06-08 DIAGNOSIS — Z853 Personal history of malignant neoplasm of breast: Secondary | ICD-10-CM | POA: Diagnosis not present

## 2023-06-08 DIAGNOSIS — F17200 Nicotine dependence, unspecified, uncomplicated: Secondary | ICD-10-CM

## 2023-06-08 DIAGNOSIS — E1165 Type 2 diabetes mellitus with hyperglycemia: Secondary | ICD-10-CM | POA: Diagnosis not present

## 2023-06-08 DIAGNOSIS — Z7901 Long term (current) use of anticoagulants: Secondary | ICD-10-CM | POA: Diagnosis not present

## 2023-06-08 DIAGNOSIS — Z86718 Personal history of other venous thrombosis and embolism: Secondary | ICD-10-CM | POA: Diagnosis not present

## 2023-06-08 DIAGNOSIS — E559 Vitamin D deficiency, unspecified: Secondary | ICD-10-CM | POA: Diagnosis not present

## 2023-06-08 DIAGNOSIS — I700 Atherosclerosis of aorta: Secondary | ICD-10-CM | POA: Diagnosis not present

## 2023-06-08 DIAGNOSIS — Z0000 Encounter for general adult medical examination without abnormal findings: Secondary | ICD-10-CM | POA: Diagnosis not present

## 2023-06-10 ENCOUNTER — Encounter: Payer: Self-pay | Admitting: Hematology and Oncology

## 2023-07-05 DIAGNOSIS — M170 Bilateral primary osteoarthritis of knee: Secondary | ICD-10-CM | POA: Diagnosis not present

## 2023-07-07 ENCOUNTER — Ambulatory Visit
Admission: RE | Admit: 2023-07-07 | Discharge: 2023-07-07 | Disposition: A | Discharge status: Home / Self Care | Payer: Medicare Other | Source: Ambulatory Visit | Attending: Registered Nurse | Admitting: Registered Nurse

## 2023-07-07 DIAGNOSIS — Z87891 Personal history of nicotine dependence: Secondary | ICD-10-CM | POA: Diagnosis not present

## 2023-07-07 DIAGNOSIS — F17200 Nicotine dependence, unspecified, uncomplicated: Secondary | ICD-10-CM

## 2023-09-08 DIAGNOSIS — E7800 Pure hypercholesterolemia, unspecified: Secondary | ICD-10-CM | POA: Diagnosis not present

## 2023-09-08 DIAGNOSIS — E1165 Type 2 diabetes mellitus with hyperglycemia: Secondary | ICD-10-CM | POA: Diagnosis not present

## 2023-09-15 DIAGNOSIS — Z86718 Personal history of other venous thrombosis and embolism: Secondary | ICD-10-CM | POA: Diagnosis not present

## 2023-09-15 DIAGNOSIS — E559 Vitamin D deficiency, unspecified: Secondary | ICD-10-CM | POA: Diagnosis not present

## 2023-09-15 DIAGNOSIS — E7800 Pure hypercholesterolemia, unspecified: Secondary | ICD-10-CM | POA: Diagnosis not present

## 2023-09-15 DIAGNOSIS — Z23 Encounter for immunization: Secondary | ICD-10-CM | POA: Diagnosis not present

## 2023-09-15 DIAGNOSIS — I2510 Atherosclerotic heart disease of native coronary artery without angina pectoris: Secondary | ICD-10-CM | POA: Diagnosis not present

## 2023-09-15 DIAGNOSIS — I700 Atherosclerosis of aorta: Secondary | ICD-10-CM | POA: Diagnosis not present

## 2023-09-15 DIAGNOSIS — E1165 Type 2 diabetes mellitus with hyperglycemia: Secondary | ICD-10-CM | POA: Diagnosis not present

## 2023-10-14 DIAGNOSIS — M170 Bilateral primary osteoarthritis of knee: Secondary | ICD-10-CM | POA: Diagnosis not present

## 2024-01-10 DIAGNOSIS — E1165 Type 2 diabetes mellitus with hyperglycemia: Secondary | ICD-10-CM | POA: Diagnosis not present

## 2024-01-15 IMAGING — DX DG KNEE COMPLETE 4+V*R*
4 series · 4 of 4 positions shown · non-contrast
Comparison: None Available.

CLINICAL DATA: Chronic right knee pain

EXAM:
RIGHT KNEE - COMPLETE 4 VIEW

[knee ap]
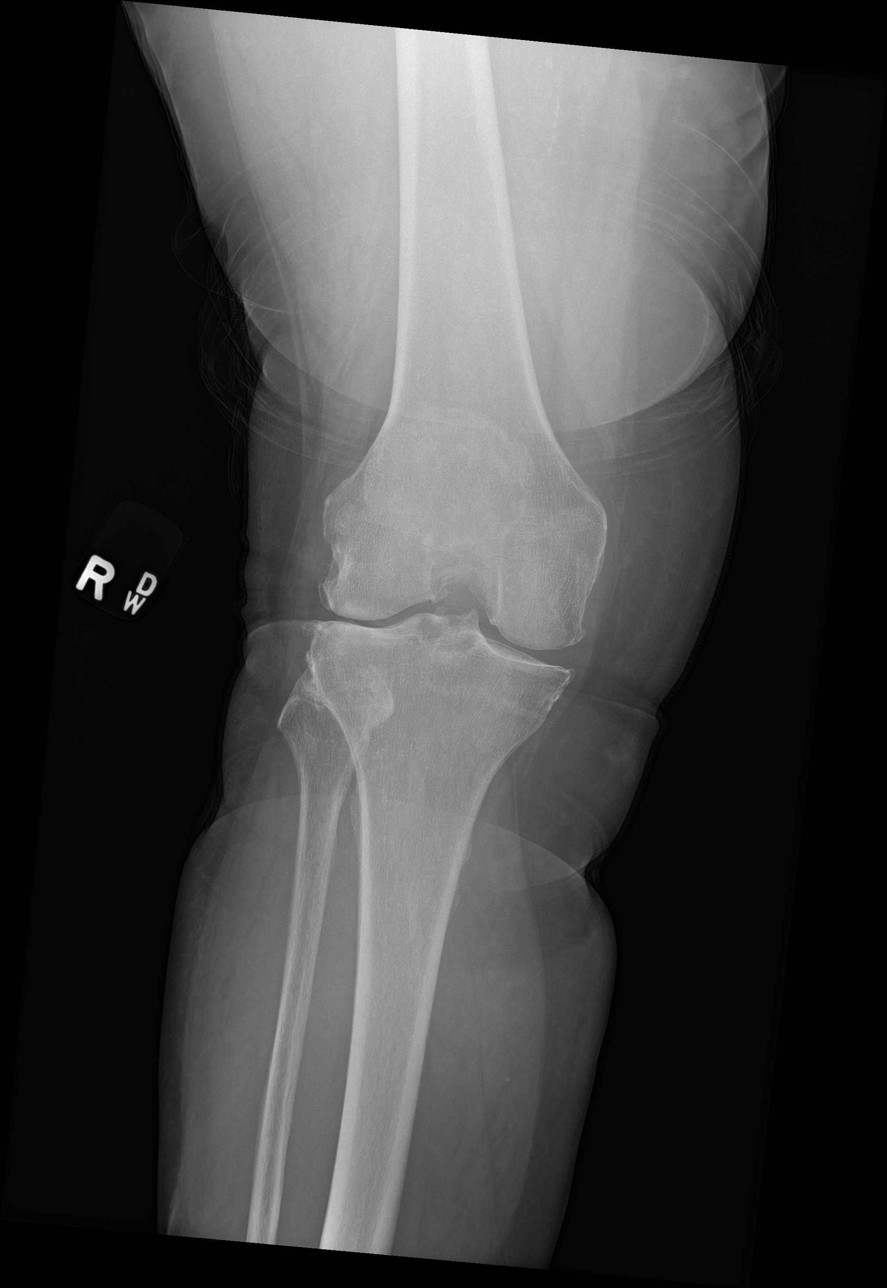

[knee lat]
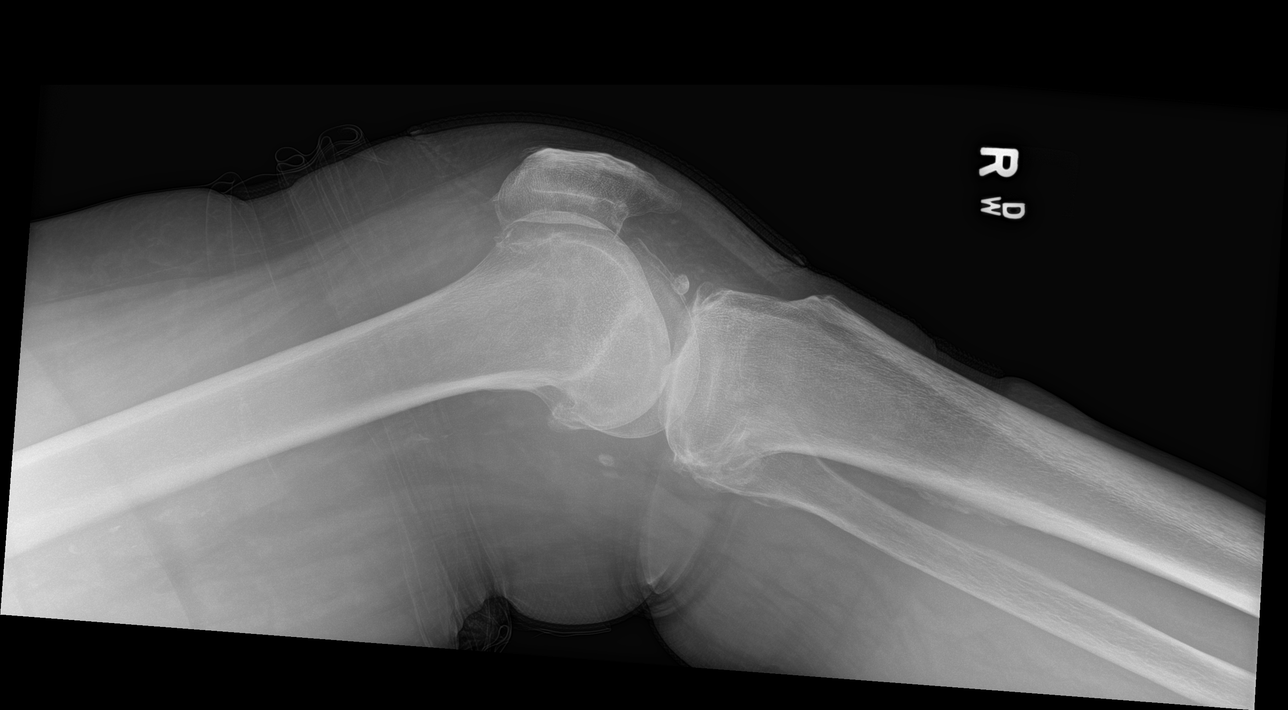

[knee obl (1 of 2)]
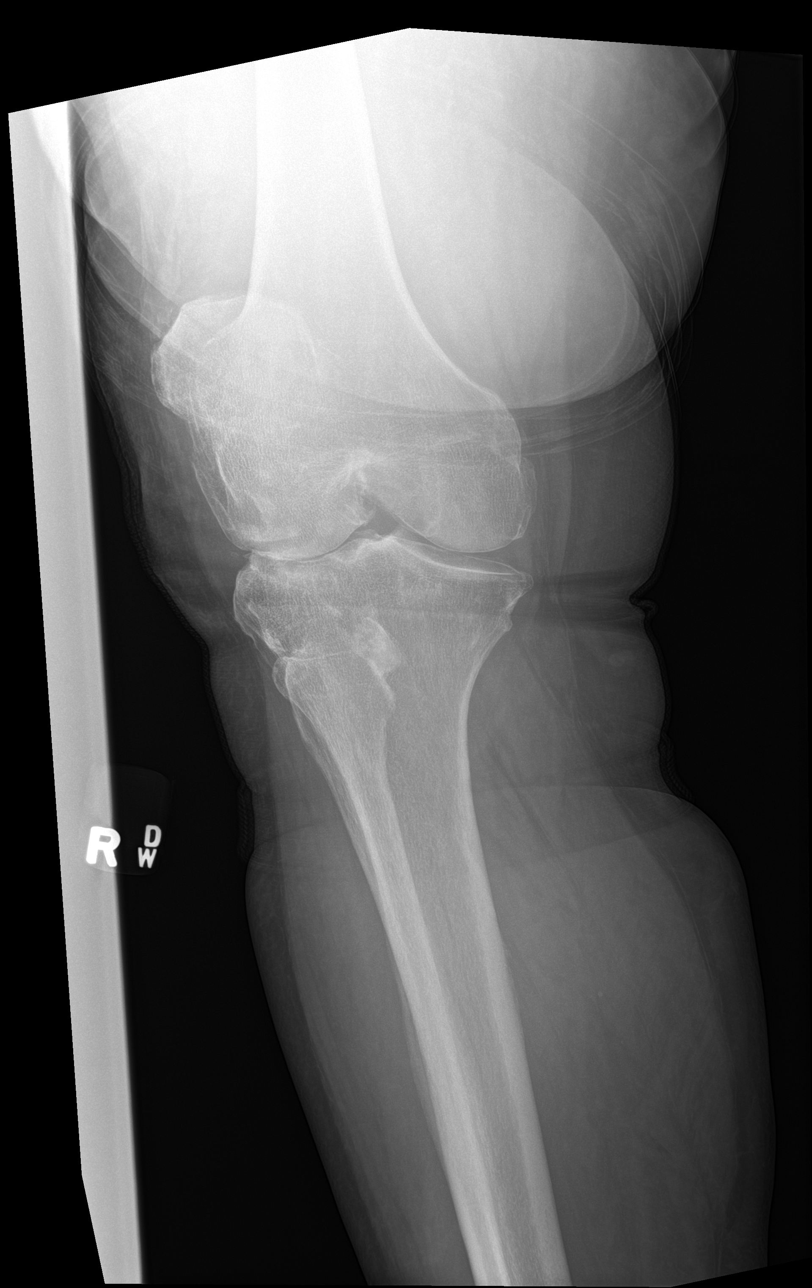

[knee obl (2 of 2)]
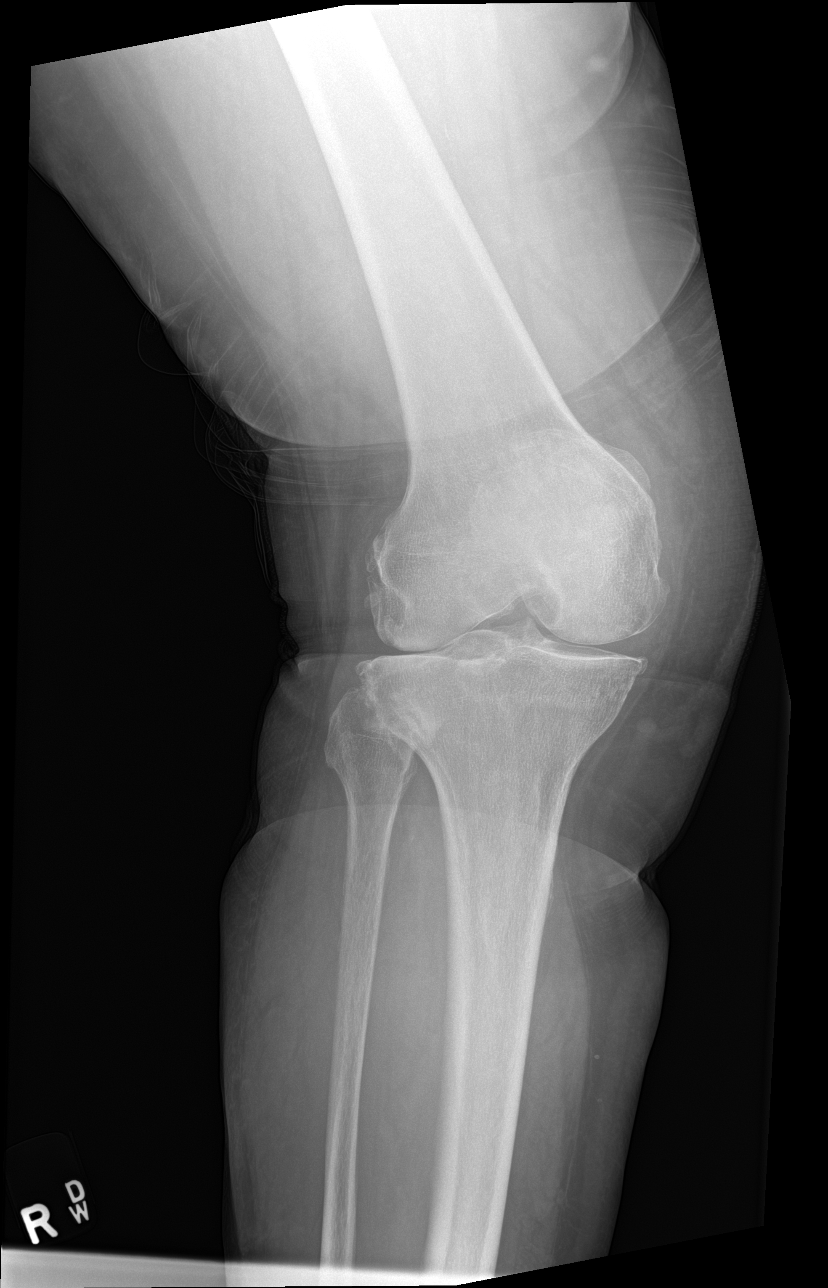

[4 of 4 positions shown; findings below may reference images not displayed]

FINDINGS: No fracture or dislocation is seen. Degenerative changes are noted
with bony spurs in medial, lateral and patellofemoral compartments.
There is narrowing of joint space, more so in the lateral
compartment. There is soft tissue fullness in the suprapatellar
bursa suggesting small to moderate effusion.
IMPRESSION: No recent fracture or dislocation is seen in the right knee.
Degenerative changes are noted with bony spurs. Small to moderate
effusion is seen in the suprapatellar bursa.

## 2024-01-17 DIAGNOSIS — E7800 Pure hypercholesterolemia, unspecified: Secondary | ICD-10-CM | POA: Diagnosis not present

## 2024-01-17 DIAGNOSIS — I82409 Acute embolism and thrombosis of unspecified deep veins of unspecified lower extremity: Secondary | ICD-10-CM | POA: Diagnosis not present

## 2024-01-17 DIAGNOSIS — Z7901 Long term (current) use of anticoagulants: Secondary | ICD-10-CM | POA: Diagnosis not present

## 2024-01-17 DIAGNOSIS — I2510 Atherosclerotic heart disease of native coronary artery without angina pectoris: Secondary | ICD-10-CM | POA: Diagnosis not present

## 2024-01-17 DIAGNOSIS — E1165 Type 2 diabetes mellitus with hyperglycemia: Secondary | ICD-10-CM | POA: Diagnosis not present

## 2024-01-17 DIAGNOSIS — Z853 Personal history of malignant neoplasm of breast: Secondary | ICD-10-CM | POA: Diagnosis not present

## 2024-01-17 DIAGNOSIS — R2231 Localized swelling, mass and lump, right upper limb: Secondary | ICD-10-CM | POA: Diagnosis not present

## 2024-01-17 DIAGNOSIS — M5432 Sciatica, left side: Secondary | ICD-10-CM | POA: Diagnosis not present

## 2024-01-17 DIAGNOSIS — E559 Vitamin D deficiency, unspecified: Secondary | ICD-10-CM | POA: Diagnosis not present

## 2024-01-17 DIAGNOSIS — I700 Atherosclerosis of aorta: Secondary | ICD-10-CM | POA: Diagnosis not present

## 2024-01-20 ENCOUNTER — Other Ambulatory Visit: Payer: Self-pay | Admitting: Registered Nurse

## 2024-01-20 DIAGNOSIS — R2231 Localized swelling, mass and lump, right upper limb: Secondary | ICD-10-CM

## 2024-01-30 DIAGNOSIS — M25511 Pain in right shoulder: Secondary | ICD-10-CM | POA: Diagnosis not present

## 2024-01-30 DIAGNOSIS — R2231 Localized swelling, mass and lump, right upper limb: Secondary | ICD-10-CM | POA: Diagnosis not present

## 2024-01-30 DIAGNOSIS — M545 Low back pain, unspecified: Secondary | ICD-10-CM | POA: Diagnosis not present

## 2024-02-20 DIAGNOSIS — R2231 Localized swelling, mass and lump, right upper limb: Secondary | ICD-10-CM | POA: Diagnosis not present

## 2024-02-27 DIAGNOSIS — D1720 Benign lipomatous neoplasm of skin and subcutaneous tissue of unspecified limb: Secondary | ICD-10-CM | POA: Diagnosis not present

## 2024-02-27 DIAGNOSIS — R2231 Localized swelling, mass and lump, right upper limb: Secondary | ICD-10-CM | POA: Diagnosis not present

## 2024-03-31 DIAGNOSIS — H2513 Age-related nuclear cataract, bilateral: Secondary | ICD-10-CM | POA: Diagnosis not present

## 2024-03-31 DIAGNOSIS — H524 Presbyopia: Secondary | ICD-10-CM | POA: Diagnosis not present

## 2024-04-11 DIAGNOSIS — D1721 Benign lipomatous neoplasm of skin and subcutaneous tissue of right arm: Secondary | ICD-10-CM | POA: Diagnosis not present

## 2024-04-11 DIAGNOSIS — M79601 Pain in right arm: Secondary | ICD-10-CM | POA: Diagnosis not present

## 2024-04-11 DIAGNOSIS — F17210 Nicotine dependence, cigarettes, uncomplicated: Secondary | ICD-10-CM | POA: Diagnosis not present

## 2024-04-11 DIAGNOSIS — Z86718 Personal history of other venous thrombosis and embolism: Secondary | ICD-10-CM | POA: Diagnosis not present

## 2024-05-04 DIAGNOSIS — M170 Bilateral primary osteoarthritis of knee: Secondary | ICD-10-CM | POA: Diagnosis not present

## 2024-05-08 ENCOUNTER — Other Ambulatory Visit: Payer: Self-pay | Admitting: Orthopedic Surgery

## 2024-05-08 DIAGNOSIS — R2231 Localized swelling, mass and lump, right upper limb: Secondary | ICD-10-CM | POA: Diagnosis not present

## 2024-05-08 DIAGNOSIS — D1721 Benign lipomatous neoplasm of skin and subcutaneous tissue of right arm: Secondary | ICD-10-CM | POA: Diagnosis not present

## 2024-05-09 LAB — SURGICAL PATHOLOGY

## 2024-05-21 DIAGNOSIS — M25631 Stiffness of right wrist, not elsewhere classified: Secondary | ICD-10-CM | POA: Diagnosis not present

## 2024-05-28 NOTE — Assessment & Plan Note (Signed)
04/27/2018:Bilateral mastectomies: Left mastectomy: Multifocal invasive lobular cancer grade 2, largest 2.1 cm smaller focus 0.7 cm, margins negative, LCIS, 0/1 lymph node ER 95%, PR 100%, HER-2 negative, Ki-67 1% T2N0;  Right mastectomy: [facility] Staging: T2N0 stage Ia Oncotype DX Recurrence score 19: Distant recurrence at 9 years: 6%, low risk No role of radiation since she had bilateral mastectomies   Current treatment: Letrozole 2.5 mg daily June 2019 (plans for 7 years)   Letrozole toxicities: 1.  Intermittent hot flashes: Mild 2. joint stiffness especially in her knees probably combination of arthritis and related to letrozole.   Breast cancer surveillance:  No role of imaging since she had bilateral mastectomies Breast Exam: 05/30/2023: Benign Bone density: 08/31/2018: T score +1: Normal.  She did not want to repeat another bone density at this time. She has a Development worker, international aid shitzu.   Her mother passed away and she no longer goes to [place] anymore.   Return to clinic in 1 year for follow-up

## 2024-05-29 ENCOUNTER — Inpatient Hospital Stay: Payer: Medicare Other | Attending: Hematology and Oncology | Admitting: Hematology and Oncology

## 2024-05-29 VITALS — BP 133/71 | HR 70 | Temp 98.6°F | Resp 17 | Wt 188.5 lb

## 2024-05-29 DIAGNOSIS — C50412 Malignant neoplasm of upper-outer quadrant of left female breast: Secondary | ICD-10-CM | POA: Insufficient documentation

## 2024-05-29 DIAGNOSIS — Z170 Estrogen receptor positive status [ER+]: Secondary | ICD-10-CM | POA: Insufficient documentation

## 2024-05-29 DIAGNOSIS — Z1721 Progesterone receptor positive status: Secondary | ICD-10-CM | POA: Diagnosis not present

## 2024-05-29 DIAGNOSIS — Z9013 Acquired absence of bilateral breasts and nipples: Secondary | ICD-10-CM | POA: Diagnosis not present

## 2024-05-29 DIAGNOSIS — Z79811 Long term (current) use of aromatase inhibitors: Secondary | ICD-10-CM | POA: Diagnosis not present

## 2024-05-29 DIAGNOSIS — Z86718 Personal history of other venous thrombosis and embolism: Secondary | ICD-10-CM | POA: Diagnosis not present

## 2024-05-29 DIAGNOSIS — Z1732 Human epidermal growth factor receptor 2 negative status: Secondary | ICD-10-CM | POA: Insufficient documentation

## 2024-05-29 MED ORDER — LETROZOLE 2.5 MG PO TABS: 2.5000 mg | ORAL_TABLET | Freq: Every day | ORAL | 3 refills | Status: AC

## 2024-05-29 NOTE — Progress Notes (Signed)
 Patient Care Team: Clarice Nottingham, MD as PCP - General (Internal Medicine) Arelia Filippo, MD as Consulting Physician (Plastic Surgery) Odean Potts, MD as Consulting Physician (Hematology and Oncology) Vanderbilt Ned, MD as Consulting Physician (General Surgery) Izell Domino, MD as Attending Physician (Radiation Oncology) Crawford Morna Pickle, NP as Nurse Practitioner (Hematology and Oncology)  DIAGNOSIS:  Encounter Diagnosis  Name Primary?   Malignant neoplasm of upper-outer quadrant of left breast in female, estrogen receptor positive (HCC) Yes    SUMMARY OF ONCOLOGIC HISTORY: Oncology History  Malignant neoplasm of upper-outer quadrant of left breast in female, estrogen receptor positive (HCC)  02/01/2018 Initial Diagnosis   Screening detected architectural distortion in the left breast with calcifications UOQ, by ultrasound 2.2 cm at 2 o'clock position axilla negative, ultrasound biopsy revealed invasive lobular cancer grade 1 with LCIS ER 95% PR 100%, Ki-67 1%, HER-2 negative ratio 1.17, T2N0 stage Ib AJCC 8   04/27/2018 Surgery   Bilateral mastectomies: Left mastectomy: Multifocal invasive lobular cancer grade 2, largest 2.1 cm, margins negative, LCIS, 0/1 lymph node ER 95%, PR 100%, HER-2 negative, Ki-67 1% T2N0; right mastectomy: [facility]   04/27/2018 Oncotype testing   Recurrence score 19: Distant recurrence at 9 years: 6%, low risk   05/05/2018 Cancer Staging   Staging form: Breast, AJCC 8th Edition - Pathologic: Stage IA (pT2(2), pN0(sn), cM0, G2, ER+, PR+, HER2-) - Signed by Odean Potts, MD on 05/05/2018   04/2018 -  Anti-estrogen oral therapy   Letrozole  daily     CHIEF COMPLIANT:   HISTORY OF PRESENT ILLNESS: Domino is a breast cancer on letrozole   History of Present Illness Julie [name] is a 73 year old female who presents for follow-up regarding her antihormone therapy.  She is in her final year of antihormone therapy and occasionally forgets to  take her medication, though she denies significant adherence issues. She experiences hot flashes, exacerbated by heat, which affect her comfort in warm environments. She reports no breast pain or discomfort.     ALLERGIES:  has no known allergies.  MEDICATIONS:  Current Outpatient Medications  Medication Sig Dispense Refill   FLUoxetine  (PROZAC ) 40 MG capsule Take 1 capsule (40 mg total) by mouth daily.  3   rivaroxaban  (XARELTO ) 10 MG TABS tablet Take 10 mg by mouth daily.     letrozole  (FEMARA ) 2.5 MG tablet Take 1 tablet (2.5 mg total) by mouth daily. 90 tablet 3   No current facility-administered medications for this visit.    PHYSICAL EXAMINATION: ECOG PERFORMANCE STATUS: 1 - Symptomatic but completely ambulatory  Vitals:   05/29/24 1131 05/29/24 1132  BP: (!) 153/84 133/71  Pulse: 73 70  Resp: 17   Temp: 98.6 F (37 C)   SpO2: 95%    Filed Weights   05/29/24 1131  Weight: 188 lb 8 oz (85.5 kg)      LABORATORY DATA:  I have reviewed the data as listed    Latest Ref Rng & Units 04/21/2018   10:22 AM 02/08/2018    8:34 AM 06/09/2017    1:39 PM  CMP  Glucose 65 - 99 mg/dL 897  876  882   BUN 6 - 20 mg/dL 13  14  16    Creatinine 0.44 - 1.00 mg/dL 8.78  8.83  8.83   Sodium 135 - 145 mmol/L 144  143  139   Potassium 3.5 - 5.1 mmol/L 4.7  4.5  3.6   Chloride 101 - 111 mmol/L 109  109  107  CO2 22 - 32 mmol/L 28  28  26    Calcium 8.9 - 10.3 mg/dL 9.3  9.8  9.1   Total Protein 6.5 - 8.1 g/dL 7.0  7.3    Total Bilirubin 0.3 - 1.2 mg/dL 0.7  0.7    Alkaline Phos 38 - 126 U/L 70  80    AST 15 - 41 U/L 16  18    ALT 14 - 54 U/L 20  24      Lab Results  Component Value Date   WBC 7.8 04/21/2018   HGB 14.1 04/21/2018   HCT 44.7 04/21/2018   MCV 93.5 04/21/2018   PLT 221 04/21/2018   NEUTROABS 4.8 04/21/2018    ASSESSMENT & PLAN:  Malignant neoplasm of upper-outer quadrant of left breast in female, estrogen receptor positive (HCC) 04/27/2018:Bilateral  mastectomies: Left mastectomy: Multifocal invasive lobular cancer grade 2, largest 2.1 cm smaller focus 0.7 cm, margins negative, LCIS, 0/1 lymph node ER 95%, PR 100%, HER-2 negative, Ki-67 1% T2N0;  Right mastectomy: [facility] Staging: T2N0 stage Ia Oncotype DX Recurrence score 19: Distant recurrence at 9 years: 6%, low risk No role of radiation since she had bilateral mastectomies   Current treatment: Letrozole  2.5 mg daily June 2019 (plans for 7 years)   Letrozole  toxicities: 1.  Intermittent hot flashes: Mild 2. joint stiffness especially in her knees probably combination of arthritis and related to letrozole .   Breast cancer surveillance:  No role of imaging since she had bilateral mastectomies Breast Exam: 05/30/2023: Benign Bone density: 08/31/2018: T score +1: Normal.  She did not want to repeat another bone density at this time. She has a Development worker, international aid shitzu.   Her mother passed away and she no longer goes to [place] anymore.   Return to clinic in 1 year for follow-up      No orders of the defined types were placed in this encounter.  The patient has a good understanding of the overall plan. she agrees with it. she will call with any problems that may develop before the next visit here. Total time spent: 30 mins including face to face time and time spent for planning, charting and co-ordination of care   Viinay K Flonnie Wierman, MD 05/29/24

## 2024-06-05 DIAGNOSIS — E559 Vitamin D deficiency, unspecified: Secondary | ICD-10-CM | POA: Diagnosis not present

## 2024-06-05 DIAGNOSIS — R7303 Prediabetes: Secondary | ICD-10-CM | POA: Diagnosis not present

## 2024-06-05 DIAGNOSIS — E7800 Pure hypercholesterolemia, unspecified: Secondary | ICD-10-CM | POA: Diagnosis not present

## 2024-06-05 DIAGNOSIS — Z0000 Encounter for general adult medical examination without abnormal findings: Secondary | ICD-10-CM | POA: Diagnosis not present

## 2024-06-12 DIAGNOSIS — E559 Vitamin D deficiency, unspecified: Secondary | ICD-10-CM | POA: Diagnosis not present

## 2024-06-12 DIAGNOSIS — Z0000 Encounter for general adult medical examination without abnormal findings: Secondary | ICD-10-CM | POA: Diagnosis not present

## 2024-06-12 DIAGNOSIS — Z86718 Personal history of other venous thrombosis and embolism: Secondary | ICD-10-CM | POA: Diagnosis not present

## 2024-06-12 DIAGNOSIS — E1165 Type 2 diabetes mellitus with hyperglycemia: Secondary | ICD-10-CM | POA: Diagnosis not present

## 2024-06-12 DIAGNOSIS — E7800 Pure hypercholesterolemia, unspecified: Secondary | ICD-10-CM | POA: Diagnosis not present

## 2024-06-12 DIAGNOSIS — I700 Atherosclerosis of aorta: Secondary | ICD-10-CM | POA: Diagnosis not present

## 2024-06-12 DIAGNOSIS — I2510 Atherosclerotic heart disease of native coronary artery without angina pectoris: Secondary | ICD-10-CM | POA: Diagnosis not present

## 2024-06-12 DIAGNOSIS — F17200 Nicotine dependence, unspecified, uncomplicated: Secondary | ICD-10-CM | POA: Diagnosis not present

## 2024-06-12 DIAGNOSIS — Z7901 Long term (current) use of anticoagulants: Secondary | ICD-10-CM | POA: Diagnosis not present

## 2024-06-14 ENCOUNTER — Encounter: Payer: Self-pay | Admitting: Registered Nurse

## 2024-06-17 NOTE — Assessment & Plan Note (Signed)
 04/27/2018:Bilateral mastectomies: Left mastectomy: Multifocal invasive lobular cancer grade 2, largest 2.1 cm smaller focus 0.7 cm, margins negative, LCIS, 0/1 lymph node ER 95%, PR 100%, HER-2 negative, Ki-67 1% T2N0;  Right mastectomy: [facility] Staging: T2N0 stage Ia Oncotype DX Recurrence score 19: Distant recurrence at 9 years: 6%, low risk No role of radiation since she had bilateral mastectomies   Current treatment: Letrozole  2.5 mg daily June 2019 (plans for 7 years)   Letrozole  toxicities: 1.  Intermittent hot flashes: Mild 2. joint stiffness especially in her knees probably combination of arthritis and related to letrozole .   Breast cancer surveillance:  No role of imaging since she had bilateral mastectomies Breast Exam: 06/18/2024: Benign Bone density: 08/31/2018: T score +1: Normal.  She did not want to repeat another bone density at this time. She has a Development worker, international aid shitzu.   Return to clinic in 1 year for follow-up

## 2024-06-18 ENCOUNTER — Inpatient Hospital Stay (HOSPITAL_BASED_OUTPATIENT_CLINIC_OR_DEPARTMENT_OTHER): Admitting: Hematology and Oncology

## 2024-06-18 DIAGNOSIS — Z170 Estrogen receptor positive status [ER+]: Secondary | ICD-10-CM | POA: Diagnosis not present

## 2024-06-18 DIAGNOSIS — Z79811 Long term (current) use of aromatase inhibitors: Secondary | ICD-10-CM

## 2024-06-18 DIAGNOSIS — C50412 Malignant neoplasm of upper-outer quadrant of left female breast: Secondary | ICD-10-CM | POA: Diagnosis not present

## 2024-06-18 DIAGNOSIS — Z7901 Long term (current) use of anticoagulants: Secondary | ICD-10-CM

## 2024-06-18 DIAGNOSIS — Z86718 Personal history of other venous thrombosis and embolism: Secondary | ICD-10-CM | POA: Diagnosis not present

## 2024-06-18 DIAGNOSIS — Z780 Asymptomatic menopausal state: Secondary | ICD-10-CM

## 2024-06-18 MED ORDER — DABIGATRAN ETEXILATE MESYLATE 150 MG PO CAPS: 150.0000 mg | ORAL_CAPSULE | Freq: Two times a day (BID) | ORAL | 3 refills | Status: AC

## 2024-06-18 NOTE — Progress Notes (Signed)
 HEMATOLOGY-ONCOLOGY TELEPHONE VISIT PROGRESS NOTE  I connected with our patient on 06/18/24 at  9:15 AM EDT by telephone and verified that I am speaking with the correct person using two identifiers.  I discussed the limitations, risks, security and privacy concerns of performing an evaluation and management service by telephone and the availability of in person appointments.  I also discussed with the patient that there may be a patient responsible charge related to this service. The patient expressed understanding and agreed to proceed.   History of Present Illness: Telephone follow-up to discuss anticoagulation  History of Present Illness Julie [name] is a 73 year old female with a history of blood clots and breast cancer who presents for medication management and follow-up. She was referred by her PA, Mary Provicept, for evaluation of her Xarelto  medication due to cost concerns.  Julie [name] has a history of breast cancer diagnosed over six years ago and is currently on letrozole , with one year remaining in her seven-year treatment plan. She experiences hot flashes as a side effect of the medication.  She has a history of a blood clot in her leg approximately ten years ago, which led to her being placed on Xarelto . She cannot recall which leg was affected. She has been on Xarelto  since the clotting event but is seeking alternatives due to the high cost of the medication, which is approximately $600 for a three-month supply.  No current chest pain or discomfort.    Oncology History  Malignant neoplasm of upper-outer quadrant of left breast in female, estrogen receptor positive (HCC)  02/01/2018 Initial Diagnosis   Screening detected architectural distortion in the left breast with calcifications UOQ, by ultrasound 2.2 cm at 2 o'clock position axilla negative, ultrasound biopsy revealed invasive lobular cancer grade 1 with LCIS ER 95% PR 100%, Ki-67 1%, HER-2 negative ratio 1.17, T2N0  stage Ib AJCC 8   04/27/2018 Surgery   Bilateral mastectomies: Left mastectomy: Multifocal invasive lobular cancer grade 2, largest 2.1 cm, margins negative, LCIS, 0/1 lymph node ER 95%, PR 100%, HER-2 negative, Ki-67 1% T2N0; right mastectomy: [facility]   04/27/2018 Oncotype testing   Recurrence score 19: Distant recurrence at 9 years: 6%, low risk   05/05/2018 Cancer Staging   Staging form: Breast, AJCC 8th Edition - Pathologic: Stage IA (pT2(2), pN0(sn), cM0, G2, ER+, PR+, HER2-) - Signed by Odean Potts, MD on 05/05/2018   04/2018 -  Anti-estrogen oral therapy   Letrozole  daily     REVIEW OF SYSTEMS:   Constitutional: Denies fevers, chills or abnormal weight loss All other systems were reviewed with the patient and are negative. Observations/Objective:     Assessment Plan:  Malignant neoplasm of upper-outer quadrant of left breast in female, estrogen receptor positive (HCC) 04/27/2018:Bilateral mastectomies: Left mastectomy: Multifocal invasive lobular cancer grade 2, largest 2.1 cm smaller focus 0.7 cm, margins negative, LCIS, 0/1 lymph node ER 95%, PR 100%, HER-2 negative, Ki-67 1% T2N0;  Right mastectomy: [facility] Staging: T2N0 stage Ia Oncotype DX Recurrence score 19: Distant recurrence at 9 years: 6%, low risk No role of radiation since she had bilateral mastectomies   Current treatment: Letrozole  2.5 mg daily June 2019 (plans for 7 years)   Letrozole  toxicities: 1.  Intermittent hot flashes: Mild 2. joint stiffness especially in her knees probably combination of arthritis and related to letrozole .   Breast cancer surveillance:  No role of imaging since she had bilateral mastectomies Bone density: 08/31/2018: T score +1: Normal. (Ordered a new bone density)  She has a Development worker, international aid shitzu.  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 Because of her multiple prior issues with her being at high risk of blood clots, she is on lifelong  anticoagulation. Because Xarelto  is almost $200 a month, we are switching her to Pradaxa .  I sent a new prescription to her [facility] pharmacy.  She will take 150 mg twice a day.   Return to clinic in 1 year for follow-up  Assessment & Plan Malignant neoplasm of upper-outer quadrant of left breast Breast cancer diagnosed over six years ago, currently on letrozole  with one year remaining. Letrozole  causing hot flashes. Bone density last checked in 2019 was good. Monitoring needed due to potential bone density reduction from letrozole . - Order bone density test at [facility] location. - Continue letrozole  for one more year.  Long-term use of anticoagulants Single blood clot approximately ten years ago, likely in the left leg, with vein surgeries. Currently on rivaroxaban , but cost is a concern. Discussed switching to dabigatran  as a cost-effective alternative, functionally similar but requires twice-daily dosing. Provided GoodRx coupon for cost reduction. - Switch from rivaroxaban  to dabigatran , 60 tablets for $66 at [facility]. - Cancel rivaroxaban  prescription with pharmacy. - Send dabigatran  prescription to [facility] on [address].      I discussed the assessment and treatment plan with the patient. The patient was provided an opportunity to ask questions and all were answered. The patient agreed with the plan and demonstrated an understanding of the instructions. The patient was advised to call back or seek an in-person evaluation if the symptoms worsen or if the condition fails to improve as anticipated.   I provided 20 minutes of non-face-to-face time during this encounter.  This includes time for charting and coordination of care   Naomi MARLA Chad, MD

## 2024-06-19 ENCOUNTER — Telehealth: Payer: Self-pay | Admitting: *Deleted

## 2024-06-19 NOTE — Telephone Encounter (Signed)
 Medication Prior Authorization Status  Processed CoverMyMeds KEY: [id number] for Pradaxa  150 mg caps  Approved Today  Per [employer] Medicare Part D  PA Case ID: [id number] Rx#: [id number]   Effective 06/19/2024 through 11/28/2024  Upon calling Prescriber Prior Authorization Line [phone]) at 1644.  CoverMyMeds unable to find matching patient with Name and Date of  Birth.. [id number] Connected with advocate Ronnie.  Call transferred.  Member has a dedicated team.   1653: Connected with Tanya with prior authorization.  Maximum allowed is a 30-day supply.  Advised Josselyne A Markovic has tried Xarelto  and Eliquis to prevent recurrence of DVT or PE.  With this information a quantity of 60 (sixty) was auto approved per Tanya.

## 2024-08-02 DIAGNOSIS — D125 Benign neoplasm of sigmoid colon: Secondary | ICD-10-CM | POA: Diagnosis not present

## 2024-08-02 DIAGNOSIS — K649 Unspecified hemorrhoids: Secondary | ICD-10-CM | POA: Diagnosis not present

## 2024-08-02 DIAGNOSIS — D124 Benign neoplasm of descending colon: Secondary | ICD-10-CM | POA: Diagnosis not present

## 2024-08-02 DIAGNOSIS — Z1211 Encounter for screening for malignant neoplasm of colon: Secondary | ICD-10-CM | POA: Diagnosis not present

## 2024-08-02 DIAGNOSIS — K5730 Diverticulosis of large intestine without perforation or abscess without bleeding: Secondary | ICD-10-CM | POA: Diagnosis not present

## 2024-08-02 DIAGNOSIS — D123 Benign neoplasm of transverse colon: Secondary | ICD-10-CM | POA: Diagnosis not present

## 2024-08-14 ENCOUNTER — Other Ambulatory Visit: Payer: Self-pay | Admitting: Registered Nurse

## 2024-08-14 DIAGNOSIS — F17200 Nicotine dependence, unspecified, uncomplicated: Secondary | ICD-10-CM

## 2024-08-16 DIAGNOSIS — M170 Bilateral primary osteoarthritis of knee: Secondary | ICD-10-CM | POA: Diagnosis not present

## 2024-09-17 ENCOUNTER — Encounter: Payer: Self-pay | Admitting: *Deleted

## 2024-09-17 NOTE — Progress Notes (Signed)
 Julie [name]                                          MRN: [id number]   09/17/2024   The [facility] Quality Team Specialist reviewed this patient medical record for the purposes of chart review for care gap closure. The following were reviewed: chart review for care gap closure-glycemic status assessment and kidney health evaluation for diabetes:eGFR  and uACR.    [facility]BCI Quality Team

## 2024-09-19 DIAGNOSIS — Z23 Encounter for immunization: Secondary | ICD-10-CM | POA: Diagnosis not present

## 2024-09-19 DIAGNOSIS — I700 Atherosclerosis of aorta: Secondary | ICD-10-CM | POA: Diagnosis not present

## 2024-09-19 DIAGNOSIS — E1165 Type 2 diabetes mellitus with hyperglycemia: Secondary | ICD-10-CM | POA: Diagnosis not present

## 2024-09-19 DIAGNOSIS — I2510 Atherosclerotic heart disease of native coronary artery without angina pectoris: Secondary | ICD-10-CM | POA: Diagnosis not present

## 2024-09-19 DIAGNOSIS — R0681 Apnea, not elsewhere classified: Secondary | ICD-10-CM | POA: Diagnosis not present

## 2024-09-19 DIAGNOSIS — T466X5A Adverse effect of antihyperlipidemic and antiarteriosclerotic drugs, initial encounter: Secondary | ICD-10-CM | POA: Diagnosis not present

## 2024-09-19 DIAGNOSIS — G720 Drug-induced myopathy: Secondary | ICD-10-CM | POA: Diagnosis not present

## 2024-09-19 DIAGNOSIS — N1832 Chronic kidney disease, stage 3b: Secondary | ICD-10-CM | POA: Diagnosis not present

## 2024-12-04 NOTE — Progress Notes (Signed)
 Julie [name]                                          MRN: [id number]   12/04/2024   The [facility]BCI Quality Team Specialist reviewed this patient medical record for the purposes of chart review for care gap closure. The following were reviewed: chart review for care gap closure-glycemic status assessment.    [facility]BCI Quality Team

## 2025-02-19 ENCOUNTER — Ambulatory Visit (HOSPITAL_COMMUNITY): Admit: 2025-02-19 | Admitting: Orthopedic Surgery

## 2025-02-19 SURGERY — ARTHROPLASTY, KNEE, TOTAL
Anesthesia: Spinal | Site: Knee | Laterality: Right

## 2025-05-29 ENCOUNTER — Ambulatory Visit: Admitting: Hematology and Oncology
